# Patient Record
Sex: Male | Born: 1985 | Race: Black or African American | Hispanic: No | Marital: Single | Smoking: Never smoker
Health system: Southern US, Community
[De-identification: ages and names within clinical notes are randomized; demographics above are authoritative.]

## PROBLEM LIST (undated history)

## (undated) DIAGNOSIS — Z9889 Other specified postprocedural states: Secondary | ICD-10-CM

## (undated) DIAGNOSIS — K219 Gastro-esophageal reflux disease without esophagitis: Secondary | ICD-10-CM

## (undated) DIAGNOSIS — T8859XA Other complications of anesthesia, initial encounter: Secondary | ICD-10-CM

## (undated) DIAGNOSIS — R011 Cardiac murmur, unspecified: Secondary | ICD-10-CM

## (undated) DIAGNOSIS — T4145XA Adverse effect of unspecified anesthetic, initial encounter: Secondary | ICD-10-CM

## (undated) DIAGNOSIS — R112 Nausea with vomiting, unspecified: Secondary | ICD-10-CM

## (undated) HISTORY — PX: OTHER SURGICAL HISTORY: SHX169

---
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
## 2007-08-16 ENCOUNTER — Emergency Department (HOSPITAL_COMMUNITY): Admission: EM | Admit: 2007-08-16 | Discharge: 2007-08-16 | Payer: Self-pay | Admitting: Emergency Medicine

## 2008-11-27 ENCOUNTER — Emergency Department (HOSPITAL_COMMUNITY): Admission: EM | Admit: 2008-11-27 | Discharge: 2008-11-27 | Payer: Self-pay | Admitting: Family Medicine

## 2008-12-21 ENCOUNTER — Ambulatory Visit (HOSPITAL_BASED_OUTPATIENT_CLINIC_OR_DEPARTMENT_OTHER): Admission: RE | Admit: 2008-12-21 | Discharge: 2008-12-21 | Payer: Self-pay | Admitting: Orthopedic Surgery

## 2010-03-06 ENCOUNTER — Emergency Department (HOSPITAL_BASED_OUTPATIENT_CLINIC_OR_DEPARTMENT_OTHER): Admission: EM | Admit: 2010-03-06 | Discharge: 2010-02-11 | Payer: Self-pay | Admitting: Emergency Medicine

## 2010-06-10 LAB — GC/CHLAMYDIA PROBE AMP, GENITAL
Chlamydia, DNA Probe: NEGATIVE
GC Probe Amp, Genital: NEGATIVE

## 2013-11-15 ENCOUNTER — Ambulatory Visit (HOSPITAL_COMMUNITY)
Admission: AD | Admit: 2013-11-15 | Discharge: 2013-11-15 | Disposition: A | Discharge status: Home / Self Care | Payer: 59 | Source: Ambulatory Visit | Attending: Urology | Admitting: Urology

## 2013-11-15 ENCOUNTER — Inpatient Hospital Stay (HOSPITAL_COMMUNITY): Payer: 59 | Admitting: Anesthesiology

## 2013-11-15 ENCOUNTER — Encounter (HOSPITAL_COMMUNITY): Payer: Self-pay | Admitting: *Deleted

## 2013-11-15 ENCOUNTER — Other Ambulatory Visit: Payer: Self-pay | Admitting: Urology

## 2013-11-15 ENCOUNTER — Encounter (HOSPITAL_COMMUNITY)
Admission: AD | Disposition: A | Discharge status: Home / Self Care | Payer: Self-pay | Source: Ambulatory Visit | Attending: Urology

## 2013-11-15 ENCOUNTER — Encounter (HOSPITAL_COMMUNITY): Payer: 59 | Admitting: Anesthesiology

## 2013-11-15 DIAGNOSIS — N4400 Torsion of testis, unspecified: Secondary | ICD-10-CM | POA: Diagnosis not present

## 2013-11-15 HISTORY — DX: Other complications of anesthesia, initial encounter: T88.59XA

## 2013-11-15 HISTORY — DX: Other specified postprocedural states: R11.2

## 2013-11-15 HISTORY — DX: Adverse effect of unspecified anesthetic, initial encounter: T41.45XA

## 2013-11-15 HISTORY — DX: Gastro-esophageal reflux disease without esophagitis: K21.9

## 2013-11-15 HISTORY — PX: SCROTAL EXPLORATION: SHX2386

## 2013-11-15 HISTORY — PX: ORCHIOPEXY: SHX479

## 2013-11-15 HISTORY — DX: Other specified postprocedural states: Z98.890

## 2013-11-15 HISTORY — DX: Cardiac murmur, unspecified: R01.1

## 2013-11-15 SURGERY — EXPLORATION, SCROTUM
Anesthesia: General | Site: Scrotum | Laterality: Left

## 2013-11-15 MED ORDER — BUPIVACAINE HCL (PF) 0.25 % IJ SOLN
INTRAMUSCULAR | Status: DC | PRN
  Administered 2013-11-15: 5 mL

## 2013-11-15 MED ORDER — DEXAMETHASONE SODIUM PHOSPHATE 10 MG/ML IJ SOLN
INTRAMUSCULAR | Status: DC | PRN
  Administered 2013-11-15: 10 mg via INTRAVENOUS

## 2013-11-15 MED ORDER — SUCCINYLCHOLINE CHLORIDE 20 MG/ML IJ SOLN
INTRAMUSCULAR | Status: DC | PRN
  Administered 2013-11-15: 100 mg via INTRAVENOUS

## 2013-11-15 MED ORDER — HYDROCODONE-ACETAMINOPHEN 5-325 MG PO TABS: 1.0000 | ORAL_TABLET | Freq: Four times a day (QID) | ORAL | Status: DC | PRN

## 2013-11-15 MED ORDER — MIDAZOLAM HCL 5 MG/5ML IJ SOLN
INTRAMUSCULAR | Status: DC | PRN
  Administered 2013-11-15: 2 mg via INTRAVENOUS

## 2013-11-15 MED ORDER — FENTANYL CITRATE 0.05 MG/ML IJ SOLN
25.0000 ug | INTRAMUSCULAR | Status: DC | PRN
  Administered 2013-11-15 (×2): 25 ug via INTRAVENOUS
  Administered 2013-11-15: 50 ug via INTRAVENOUS

## 2013-11-15 MED ORDER — FENTANYL CITRATE 0.05 MG/ML IJ SOLN
INTRAMUSCULAR | Status: DC | PRN
  Administered 2013-11-15: 200 ug via INTRAVENOUS
  Administered 2013-11-15: 50 ug via INTRAVENOUS

## 2013-11-15 MED ORDER — PROPOFOL 10 MG/ML IV BOLUS
INTRAVENOUS | Status: DC | PRN
  Administered 2013-11-15: 200 mg via INTRAVENOUS

## 2013-11-15 MED ORDER — LIDOCAINE HCL (CARDIAC) 20 MG/ML IV SOLN
INTRAVENOUS | Status: DC | PRN
  Administered 2013-11-15: 100 mg via INTRAVENOUS

## 2013-11-15 MED ORDER — CEFAZOLIN SODIUM-DEXTROSE 2-3 GM-% IV SOLR
2.0000 g | INTRAVENOUS | Status: AC
  Administered 2013-11-15: 2 g via INTRAVENOUS

## 2013-11-15 MED ORDER — LACTATED RINGERS IV SOLN
INTRAVENOUS | Status: DC | PRN
  Administered 2013-11-15: 19:00:00 via INTRAVENOUS

## 2013-11-15 MED ORDER — ONDANSETRON HCL 4 MG/2ML IJ SOLN
INTRAMUSCULAR | Status: DC | PRN
  Administered 2013-11-15: 4 mg via INTRAVENOUS

## 2013-11-15 MED ORDER — BUPIVACAINE HCL (PF) 0.25 % IJ SOLN
INTRAMUSCULAR | Status: AC
  Filled 2013-11-15: qty 30

## 2013-11-15 MED ORDER — LACTATED RINGERS IV SOLN: INTRAVENOUS | Status: DC

## 2013-11-15 SURGICAL SUPPLY — 24 items
BLADE HEX COATED 2.75 (ELECTRODE) ×4 IMPLANT
BNDG GAUZE ELAST 4 BULKY (GAUZE/BANDAGES/DRESSINGS) ×4 IMPLANT
COVER SURGICAL LIGHT HANDLE (MISCELLANEOUS) ×4 IMPLANT
DRAPE PED LAPAROTOMY (DRAPES) ×4 IMPLANT
ELECT REM PT RETURN 9FT ADLT (ELECTROSURGICAL) ×4
ELECTRODE REM PT RTRN 9FT ADLT (ELECTROSURGICAL) ×2 IMPLANT
GLOVE BIOGEL M STRL SZ7.5 (GLOVE) ×4 IMPLANT
GOWN STRL REUS W/TWL LRG LVL3 (GOWN DISPOSABLE) ×4 IMPLANT
KIT BASIN OR (CUSTOM PROCEDURE TRAY) ×4 IMPLANT
NEEDLE HYPO 22GX1.5 SAFETY (NEEDLE) IMPLANT
NS IRRIG 1000ML POUR BTL (IV SOLUTION) ×4 IMPLANT
PACK GENERAL/GYN (CUSTOM PROCEDURE TRAY) ×4 IMPLANT
SUPPORT SCROTAL LG STRP (MISCELLANEOUS) ×3 IMPLANT
SUPPORTER ATHLETIC LG (MISCELLANEOUS) ×1
SUT CHROMIC 3 0 SH 27 (SUTURE) ×12 IMPLANT
SUT CHROMIC 4 0 PS 2 18 (SUTURE) ×4 IMPLANT
SUT PROLENE 4 0 RB 1 (SUTURE) ×2
SUT PROLENE 4-0 RB1 .5 CRCL 36 (SUTURE) ×2 IMPLANT
SUT VIC AB 2-0 UR5 27 (SUTURE) IMPLANT
SUT VICRYL 0 TIES 12 18 (SUTURE) IMPLANT
SYR CONTROL 10ML LL (SYRINGE) IMPLANT
TOWEL OR 17X26 10 PK STRL BLUE (TOWEL DISPOSABLE) ×8 IMPLANT
TOWEL OR NON WOVEN STRL DISP B (DISPOSABLE) ×4 IMPLANT
WATER STERILE IRR 1500ML POUR (IV SOLUTION) ×4 IMPLANT

## 2013-11-15 NOTE — Op Note (Signed)
Preoperative diagnosis:  1.  Left testicular torsion  Postoperative diagnosis: 1. Left testicular torsion  Procedure(s): 1. Scrotal exploration 2. Bilateral orchidopexy  Surgeon: Dr. Rolly SalterLester S. Laylana Gerwig, Jr  Assistant: Dr. Julius BowelsJed Ferguson  Anesthesia: General  Complications: None  EBL: Minimal  Intraoperative findings: Intraoperative inspection revealed a torsed left testis.  The left testis appeared non-viable.  The right testis appeared normal.  Indication: Mr. Whitney PostLogan is a 28 year old gentleman who presented today after developing left-sided testicular pain 1 week ago.  His pain was severe for 2-3 days and then subsequently improved.  His pain worsened today and he presented for evaluation.  He underwent a scrotal ultrasound and findings were consistent with left testicular torsion as well as his physical exam.  He was recommended to undergo urgent scrotal exploration for further evaluation.  He was counseled that he likely would have a nonsalvageable left testis considering his pain began 1 week ago.  It was recommended that if this testis was deemed to be unsalvageable that he undergo an orchiectomy.  It was explained that leaving a nonsalvageable testis in place might result in continued chronic pain and may place him at risk for infection or other, complications and that he likely would develop an atrophic testis over time.  Despite this counseling, he adamantly wish to keep this testis even if it was deemed to be unsalvageable.  It was also recommended that he undergo a right-sided orchiopexy to prevent contralateral torsion in the future.  We reviewed the potential risks and complications associated with the planned procedure as well as the expected recovery process.  He expresses understanding and gave his informed consent.  Description of procedure:  The patient was taken to the operating room and a general anesthetic was administered.  He was given preoperative antibiotics, placed in  the supine position, and prepped and draped in the usual sterile fashion.  Next, a preoperative timeout was performed.  An incision was made in the median raphae of the scrotum and carried down through dartos fascia.  The underlying spermatic fascial layers were incised and the tunic vaginalis was opened.  There was noted to be some dark fluid which was removed.  The testis was clearly noted to be torsed.  It was detorsed and continued to remain dark without evidence of viability.  No testicular masses were noted.  He did have an appendix testis which was identified and removed.  Considering the patient's adamant request to keep his testis, he underwent a left-sided orchiopexy.  4-0 Prolene sutures were placed for 3 point fixation between the tunica albuginea and the medial, inferior, and lateral scrotal wall.  Dartos fascia and the spermatic fascial layers were then incised over the right testis which was delivered after opening the tunica vaginalis.  This testis appeared to be normal in appearance without masses and appeared to be quite viable.  An appendix testis was again identified and was removed. Again, 4-0 Prolene sutures were used to perform a 3 point fixation orchiopexy in a similar fashion the contralateral side.  The dartos muscular layers were then closed with a running 3-0 chromic suture.  The skin was reapproximated with a 4-0 chromic running horizontal mattress suture.  Sterile dressing was applied.  The patient tolerated the procedure well and without complications.  He was able to be awakened and transferred to the recovery unit in satisfactory condition.

## 2013-11-15 NOTE — Anesthesia Procedure Notes (Signed)
Procedure Name: Intubation Date/Time: 11/15/2013 7:03 PM Performed by: Leroy LibmanEARDON, Carlos Vigorito L Patient Re-evaluated:Patient Re-evaluated prior to inductionOxygen Delivery Method: Circle system utilized Preoxygenation: Pre-oxygenation with 100% oxygen Intubation Type: IV induction, Cricoid Pressure applied and Rapid sequence Laryngoscope Size: Miller and 3 Grade View: Grade I Tube type: Oral Tube size: 8.0 mm Number of attempts: 1 Airway Equipment and Method: Stylet Placement Confirmation: ETT inserted through vocal cords under direct vision,  breath sounds checked- equal and bilateral and positive ETCO2 Secured at: 21 cm Tube secured with: Tape Dental Injury: Teeth and Oropharynx as per pre-operative assessment

## 2013-11-15 NOTE — H&P (Signed)
Chief Complaint Left testicular pain   History of Present Illness Carlos Lewis is a 28 year old gentleman who presents today after developing severe left sided testicular pain 1 week ago. He states that he has had intermittent testicular pain throughout the years ever since he was 16. This most recent episode began 1 week ago and was severe and localized to the left side for 2-3 days. The pain in the testicle gradually subsided although he then noted increased swelling of the left hemiscrotum and the fact that his testis became very hard and firm. He continued having pain in the subinguinal region which brought him into the office today. He has denied any fever or associated voiding symptoms. There is no family history of testicular cancer.   Past Medical History Problems  1. History of cardiac murmur (V12.59) 2. History of esophageal reflux (V12.79)  Surgical History Problems  1. 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 History of Resect Lung, Chest Wall With Reconstruction And Prosthesis  Current Meds 1. No Reported Medications Recorded  Allergies Medication  1. No Known Drug Allergies  Family History Problems  1. Family history of Eye degeneration : Mother 2. Family history of diabetes mellitus (V18.0) : Mother 3. Family history of malignant neoplasm (V16.9) : Paternal Great Grandmother, Paternal  Uncle  Social History Problems    Never smoker  Review of Systems Constitutional, skin, eye, otolaryngeal, hematologic/lymphatic, cardiovascular, pulmonary, endocrine, musculoskeletal, gastrointestinal, neurological and psychiatric system(s) were reviewed and pertinent findings if present are noted.    Vitals Vital Signs [Data Includes: Last 1 Day]  Recorded: 19Aug2015 03:22PM  Height: 5 ft 11 in Weight: 195 lb  BMI Calculated: 27.2 BSA Calculated: 2.09 Blood Pressure: 135 / 77 Temperature: 97.9 F Heart Rate: 96  Physical Exam Constitutional:  Well nourished and well developed . No acute distress.  ENT:. The ears and nose are normal in appearance.  Neck: The appearance of the neck is normal and no neck mass is present.  Pulmonary: No respiratory distress, normal respiratory rhythm and effort and clear bilateral breath sounds.  Cardiovascular: Heart rate and rhythm are normal . No peripheral edema.  Abdomen: The abdomen is soft and nontender. No masses are palpated. No CVA tenderness. No hernias are palpable. No hepatosplenomegaly noted.  Genitourinary: He has a normal male phallus with a normal urethral meatus. The right testis is palpably normal and nontender and without masses. The left testis is enlarged and firm and mildly tender. No masses are noted. He has tenderness located along the distal spermatic cord. There are no scrotal lesions.  Lymphatics: The femoral and inguinal nodes are not enlarged or tender.  Skin: Normal skin turgor, no visible rash and no visible skin lesions.  Neuro/Psych:. Mood and affect are appropriate.    Results/Data Urine [Data Includes: Last 1 Day]   19Aug2015  COLOR YELLOW   APPEARANCE CLEAR   SPECIFIC GRAVITY 1.025   pH 5.5   GLUCOSE NEG mg/dL  BILIRUBIN NEG   KETONE NEG mg/dL  BLOOD SMALL   PROTEIN NEG mg/dL  UROBILINOGEN 0.2 mg/dL  NITRITE NEG   LEUKOCYTE ESTERASE NEG   SQUAMOUS EPITHELIAL/HPF RARE   WBC NONE SEEN WBC/hpf  RBC 0-2 RBC/hpf  BACTERIA NONE SEEN   CRYSTALS NONE SEEN   CASTS NONE SEEN    A scrotal ultrasound was performed due to concerns about testicular torsion. Findings are as dictated separately.   Assessment Assessed  1. Testicular pain (608.9)  Plan Health Maintenance  1. UA  With REFLEX; [Do Not Release]; Status:Complete;   Done: 19Aug2015 03:02PM Testicular pain  2. SCROTAL U/S; Status:Resulted - Requires Verification;   Done: 19Aug2015 12:00AM  Discussion/Summary 1. Left testicular torsion: He appears to have left testicular torsion based on his ultrasound  and his history. Considering the fact this began 1 week ago, I explained that this testis is likely nonviable and nonsalvageable. However, I did recommend proceeding with scrotal exploration for evaluation with a small chance we could potentially salvage the testis and particularly to proceed with orchiopexy of the contralateral right testis to avoid torsion on that side. I recommended that we remove the left testis if it was nonsalvageable. However, patient strongly wishes to leave the testis in place despite my discussion with him that this may result in prolonged scrotal pain. He also understands that this testis would likely become atrophic and small and would not provide him any functional benefit. 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 We reviewed the potential risks of the surgery including but not limited to bleeding, infection, risks of anesthesia, injury or damage to the testicles which could result in testosterone deficiency or infertility, risk of need for further procedures, continued scrotal pain, etc. He expressed his understanding and gives his informed consent to proceed as planned.   Signatures Electronically signed by : Heloise Purpura, M.D.; Nov 15 2013  5:11PM EST

## 2013-11-15 NOTE — Progress Notes (Signed)
Dr Leta JunglingEwell made aware that patient had spinach and carrots at 1500 today.

## 2013-11-15 NOTE — Anesthesia Preprocedure Evaluation (Addendum)
Anesthesia Evaluation  Patient identified by MRN, date of birth, ID band Patient awake    Reviewed: Allergy & Precautions, H&P , NPO status , Patient's Chart, lab work & pertinent test results  Airway Mallampati: II  TM Distance: >3 FB Neck ROM: full    Dental no notable dental hx. (+) Teeth Intact, Dental Advisory Given   Pulmonary neg pulmonary ROS,  breath sounds clear to auscultation  Pulmonary exam normal       Cardiovascular Exercise Tolerance: Good negative cardio ROS  Rhythm:regular Rate:Normal     Neuro/Psych negative neurological ROS  negative psych ROS   GI/Hepatic negative GI ROS, Neg liver ROS,   Endo/Other  negative endocrine ROS  Renal/GU negative Renal ROS  negative genitourinary   Musculoskeletal   Abdominal   Peds  Hematology negative hematology ROS (+)   Anesthesia Other Findings   Reproductive/Obstetrics negative OB ROS                             Anesthesia Physical Anesthesia Plan  ASA: I and emergent  Anesthesia Plan: General   Post-op Pain Management:    Induction: Intravenous, Rapid sequence and Cricoid pressure planned  Airway Management Planned: Oral ETT  Additional Equipment:   Intra-op Plan:   Post-operative Plan: Extubation in OR  Informed Consent: I have reviewed the patients History and Physical, chart, labs and discussed the procedure including the risks, benefits and alternatives for the proposed anesthesia with the patient or authorized representative who has indicated his/her understanding and acceptance.   Dental Advisory Given  Plan Discussed with: CRNA and Surgeon  Anesthesia Plan Comments:         Anesthesia Quick Evaluation  

## 2013-11-15 NOTE — Discharge Instructions (Signed)
1) Keep ice on scrotum intermittently for up to 48-72 hours. 2) Try to limit activity and try to rest as much as possible for next 72 hours. 3) Avoid lifting > 10 lbs for a minimum of 2 weeks and longer if still having pain. 4) Call if fever > 101, pain that is not controlled, or any problems with wound healing.

## 2013-11-15 NOTE — Anesthesia Postprocedure Evaluation (Signed)
  Anesthesia Post-op Note  Patient: Carlos Lewis  Procedure(s) Performed: Procedure(s) (LRB): SCROTUM EXPLORATION (Left) bilateral orchiopexy (Bilateral)  Patient Location: PACU  Anesthesia Type: General  Level of Consciousness: awake and alert   Airway and Oxygen Therapy: Patient Spontanous Breathing  Post-op Pain: mild  Post-op Assessment: Post-op Vital signs reviewed, Patient's Cardiovascular Status Stable, Respiratory Function Stable, Patent Airway and No signs of Nausea or vomiting  Last Vitals:  Filed Vitals:   11/15/13 2045  BP: 131/75  Pulse: 95  Temp:   Resp: 13    Post-op Vital Signs: stable   Complications: No apparent anesthesia complications

## 2013-11-15 NOTE — Transfer of Care (Signed)
Immediate Anesthesia Transfer of Care Note  Patient: Carlos Lewis  Procedure(s) Performed: Procedure(s): SCROTUM EXPLORATION (Left) bilateral orchiopexy (Bilateral)  Patient Location: PACU  Anesthesia Type:General  Level of Consciousness: awake and alert   Airway & Oxygen Therapy: Patient Spontanous Breathing and Patient connected to face mask oxygen  Post-op Assessment: Report given to PACU RN and Post -op Vital signs reviewed and stable  Post vital signs: Reviewed and stable  Complications: No apparent anesthesia complications

## 2013-11-16 ENCOUNTER — Encounter (HOSPITAL_COMMUNITY): Payer: Self-pay | Admitting: Urology

## 2018-12-13 ENCOUNTER — Emergency Department (HOSPITAL_BASED_OUTPATIENT_CLINIC_OR_DEPARTMENT_OTHER): Payer: 59

## 2018-12-13 ENCOUNTER — Other Ambulatory Visit: Payer: Self-pay

## 2018-12-13 ENCOUNTER — Encounter (HOSPITAL_BASED_OUTPATIENT_CLINIC_OR_DEPARTMENT_OTHER): Payer: Self-pay

## 2018-12-13 ENCOUNTER — Emergency Department (HOSPITAL_BASED_OUTPATIENT_CLINIC_OR_DEPARTMENT_OTHER)
Admission: EM | Admit: 2018-12-13 | Discharge: 2018-12-14 | Disposition: A | Discharge status: Home / Self Care | Payer: 59 | Attending: Emergency Medicine | Admitting: Emergency Medicine

## 2018-12-13 DIAGNOSIS — Z79899 Other long term (current) drug therapy: Secondary | ICD-10-CM | POA: Diagnosis not present

## 2018-12-13 DIAGNOSIS — F419 Anxiety disorder, unspecified: Secondary | ICD-10-CM | POA: Diagnosis not present

## 2018-12-13 DIAGNOSIS — R079 Chest pain, unspecified: Secondary | ICD-10-CM | POA: Diagnosis present

## 2018-12-13 LAB — BASIC METABOLIC PANEL
Anion gap: 13 (ref 5–15)
BUN: 12 mg/dL (ref 6–20)
CO2: 24 mmol/L (ref 22–32)
Calcium: 9.8 mg/dL (ref 8.9–10.3)
Chloride: 103 mmol/L (ref 98–111)
Creatinine, Ser: 1.2 mg/dL (ref 0.61–1.24)
GFR calc Af Amer: >60 mL/min (ref >60)
GFR calc non Af Amer: >60 mL/min (ref >60)
Glucose, Bld: 106 mg/dL — ABNORMAL HIGH (ref 70–99)
Potassium: 3.4 mmol/L — ABNORMAL LOW (ref 3.5–5.1)
Sodium: 140 mmol/L (ref 135–145)

## 2018-12-13 LAB — CBC
HCT: 45.5 % (ref 39.0–52.0)
Hemoglobin: 15.2 g/dL (ref 13.0–17.0)
MCH: 28.8 pg (ref 26.0–34.0)
MCHC: 33.4 g/dL (ref 30.0–36.0)
MCV: 86.2 fL (ref 80.0–100.0)
Platelets: 259 10*3/uL (ref 150–400)
RBC: 5.28 MIL/uL (ref 4.22–5.81)
RDW: 12.1 % (ref 11.5–15.5)
WBC: 7.9 10*3/uL (ref 4.0–10.5)
nRBC: 0 % (ref 0.0–0.2)

## 2018-12-13 LAB — TROPONIN I (HIGH SENSITIVITY): Troponin I (High Sensitivity): <2 ng/L (ref <18)

## 2018-12-13 MED ORDER — SODIUM CHLORIDE 0.9% FLUSH
3.0000 mL | Freq: Once | INTRAVENOUS | Status: DC
  Filled 2018-12-13: qty 3

## 2018-12-13 NOTE — ED Triage Notes (Signed)
Pt states he has been having stress x 2-3 months-CP x 2-3 weeks-anxiety attacks x 2-3 days-NAD-steady gait

## 2018-12-14 ENCOUNTER — Encounter (HOSPITAL_BASED_OUTPATIENT_CLINIC_OR_DEPARTMENT_OTHER): Payer: Self-pay | Admitting: Emergency Medicine

## 2018-12-14 LAB — D-DIMER, QUANTITATIVE: D-Dimer, Quant: 0.29 ug/mL-FEU (ref 0.00–0.50)

## 2018-12-14 NOTE — ED Provider Notes (Signed)
[facility] EMERGENCY DEPARTMENT Provider Note   CSN: [id number] Arrival date & time: 12/13/18  2227     History   Chief Complaint Chief Complaint  Patient presents with  . Chest Pain  . Anxiety    HPI Carlos Lewis is a 33 y.o. male.     The history is provided by the patient.  Anxiety This is a chronic problem. The current episode started more than 1 week ago. The problem occurs constantly. The problem has not changed since onset.Associated symptoms include chest pain. Pertinent negatives include no abdominal pain, no headaches and no shortness of breath. Nothing aggravates the symptoms. Nothing relieves the symptoms. He has tried nothing for the symptoms. The treatment provided no relief.  No DOE, no SOB.    Past Medical History:  Diagnosis Date  . Cardiac murmur   . Complication of anesthesia   . Esophageal reflux   . PONV (postoperative nausea and vomiting)     There are no active problems to display for this patient.   Past Surgical History:  Procedure Laterality Date  . ORCHIOPEXY Bilateral 11/15/2013   Procedure: bilateral orchiopexy;  Surgeon: Heloise PurpuraLester Borden, MD;  Location: [facility] ORS;  Service: Urology;  Laterality: Bilateral;  . pectorial muscle Left   . SCROTAL EXPLORATION Left 11/15/2013   Procedure: SCROTUM EXPLORATION;  Surgeon: Heloise PurpuraLester Borden, MD;  Location: [facility];  Service: Urology;  Laterality: Left;        Home Medications    Prior to Admission medications   Medication Sig Start Date End Date Taking? Authorizing Provider  CHOLINE PO Take 1 tablet by mouth daily.    [provider]  HYDROcodone-acetaminophen (NORCO/VICODIN) 5-325 MG per tablet Take 1-2 tablets by mouth every 6 (six) hours as needed. 11/15/13   Heloise PurpuraBorden, Lester, MD  PROTEIN PO Take 1 tablet by mouth daily.    [provider]    Family History No family history on file.  Social History Social History   Tobacco Use  . Smoking status: Never Smoker  .  Smokeless tobacco: Never Used  Substance Use Topics  . Alcohol use: Yes    Comment: occasionally  . Drug use: No     Allergies   Other   Review of Systems Review of Systems  Constitutional: Negative for diaphoresis and fever.  HENT: Negative for congestion.   Eyes: Negative for visual disturbance.  Respiratory: Negative for shortness of breath.   Cardiovascular: Positive for chest pain. Negative for palpitations and leg swelling.  Gastrointestinal: Negative for abdominal pain.  Genitourinary: Negative for difficulty urinating.  Musculoskeletal: Negative for arthralgias.  Skin: Negative for wound.  Neurological: Negative for headaches.  Psychiatric/Behavioral: Negative for agitation.  All other systems reviewed and are negative.    Physical Exam Updated Vital Signs BP 136/78 (BP Location: Right Arm)   Pulse 80   Temp 99.5 F (37.5 C) (Oral)   Resp 16   Ht 5\' 11"  (1.803 m)   Wt 85.7 kg   SpO2 99%   BMI 26.36 kg/m   Physical Exam Vitals signs and nursing note reviewed.  Constitutional:      General: He is not in acute distress.    Appearance: He is normal weight.  HENT:     Head: Normocephalic and atraumatic.     Nose: Nose normal.  Eyes:     Conjunctiva/sclera: Conjunctivae normal.     Pupils: Pupils are equal, round, and reactive to light.  Neck:     Musculoskeletal: Normal  range of motion and neck supple.  Cardiovascular:     Rate and Rhythm: Normal rate and regular rhythm.     Pulses: Normal pulses.     Heart sounds: Normal heart sounds.  Pulmonary:     Effort: Pulmonary effort is normal.     Breath sounds: Normal breath sounds.  Abdominal:     General: Abdomen is flat. Bowel sounds are normal.     Tenderness: There is no abdominal tenderness. There is no guarding.  Musculoskeletal: Normal range of motion.  Skin:    General: Skin is warm and dry.     Capillary Refill: Capillary refill takes less than 2 seconds.  Neurological:     Mental Status:  He is alert and oriented to person, place, and time.  Psychiatric:        Mood and Affect: Mood is anxious.        Speech: Speech is not rapid and pressured.        Thought Content: Thought content does not include homicidal or suicidal plan.      ED Treatments / Results  Labs (all labs ordered are listed, but only abnormal results are displayed) Results for orders placed or performed during the hospital encounter of 12/13/18  Basic metabolic panel  Result Value Ref Range   Sodium 140 135 - 145 mmol/L   Potassium 3.4 (L) 3.5 - 5.1 mmol/L   Chloride 103 98 - 111 mmol/L   CO2 24 22 - 32 mmol/L   Glucose, Bld 106 (H) 70 - 99 mg/dL   BUN 12 6 - 20 mg/dL   Creatinine, Ser 2.95 0.61 - 1.24 mg/dL   Calcium 9.8 8.9 - 28.4 mg/dL   GFR calc non Af Amer >60 >60 mL/min   GFR calc Af Amer >60 >60 mL/min   Anion gap 13 5 - 15  CBC  Result Value Ref Range   WBC 7.9 4.0 - 10.5 K/uL   RBC 5.28 4.22 - 5.81 MIL/uL   Hemoglobin 15.2 13.0 - 17.0 g/dL   HCT 13.2 44.0 - 10.2 %   MCV 86.2 80.0 - 100.0 fL   MCH 28.8 26.0 - 34.0 pg   MCHC 33.4 30.0 - 36.0 g/dL   RDW 72.5 36.6 - 44.0 %   Platelets 259 150 - 400 K/uL   nRBC 0.0 0.0 - 0.2 %  D-dimer, quantitative (not at [facility])  Result Value Ref Range   D-Dimer, Quant 0.29 0.00 - 0.50 ug/mL-FEU  Troponin I (High Sensitivity)  Result Value Ref Range   Troponin I (High Sensitivity) <2 <18 ng/L   Dg Chest 2 View  Result Date: 12/13/2018 CLINICAL DATA:  Chest pain for 2-3 weeks EXAM: CHEST - 2 VIEW COMPARISON:  None. FINDINGS: The heart size and mediastinal contours are within normal limits. Both lungs are clear. The visualized skeletal structures are unremarkable. IMPRESSION: No active cardiopulmonary disease. Electronically Signed   By: Alcide Clever M.D.   On: 12/13/2018 23:25    EKG EKG Interpretation  Date/Time:  Tuesday December 13 2018 22:37:42 EDT Ventricular Rate:  104 PR Interval:  192 QRS Duration: 90 QT Interval:  304 QTC  Calculation: 399 R Axis:   90 Text Interpretation:  Sinus tachycardia Rightward axis Confirmed by Nicanor Alcon, Deandre Stansel ([id number]) on 12/13/2018 11:44:30 PM   Radiology Dg Chest 2 View  Result Date: 12/13/2018 CLINICAL DATA:  Chest pain for 2-3 weeks EXAM: CHEST - 2 VIEW COMPARISON:  None. FINDINGS: The heart size and mediastinal contours are  within normal limits. Both lungs are clear. The visualized skeletal structures are unremarkable. IMPRESSION: No active cardiopulmonary disease. Electronically Signed   By: Inez Catalina M.D.   On: 12/13/2018 23:25    Procedures Procedures (including critical care time)  Medications Ordered in ED Medications  sodium chloride flush (NS) 0.9 % injection 3 mL (has no administration in time range)    Counseled patient on stress management. Given outpatient resources.  Patient does not meet criteria for inpatient [facility] assessment.  Patient is stable for discharge.  Given time course of symptoms one negative troponin is sufficient to exclude ACS heart score is 1 very low risk for MACE.  Ruled out for PE with negative Ddimer.   Carlos Lewis was evaluated in Emergency Department on 12/14/2018 for the symptoms described in the history of present illness. He was evaluated in the context of the global COVID-19 pandemic, which necessitated consideration that the patient might be at risk for infection with the SARS-CoV-2 virus that causes COVID-19. Institutional protocols and algorithms that pertain to the evaluation of patients at risk for COVID-19 are in a state of rapid change based on information released by regulatory bodies including the CDC and federal and state organizations. These policies and algorithms were followed during the patient's care in the ED.   Final Clinical Impressions(s) / ED Diagnoses   Final diagnoses:  Anxiety    Return for intractable cough, coughing up blood,fevers >100.4 unrelieved by medication, shortness of breath, intractable vomiting, chest  pain, shortness of breath, weakness,numbness, changes in speech, facial asymmetry,abdominal pain, passing out,Inability to tolerate liquids or food, cough, altered mental status or any concerns. No signs of systemic illness or infection. The patient is nontoxic-appearing on exam and vital signs are within normal limits.   I have reviewed the triage vital signs and the nursing notes. Pertinent labs &imaging results that were available during my care of the patient were reviewed by me and considered in my medical decision making (see chart for details).After history, exam, and medical workup I feel the patient has beenappropriately medically screened and is safe for discharge home. Pertinent diagnoses were discussed with the patient. Patient was given return precautions.   Ehtan Delfavero, MD 12/14/18 [phone]

## 2020-08-18 ENCOUNTER — Emergency Department (HOSPITAL_COMMUNITY)
Admission: EM | Admit: 2020-08-18 | Discharge: 2020-08-18 | Disposition: A | Discharge status: Home / Self Care | Payer: 59 | Attending: Emergency Medicine | Admitting: Emergency Medicine

## 2020-08-18 ENCOUNTER — Other Ambulatory Visit: Payer: Self-pay

## 2020-08-18 ENCOUNTER — Encounter (HOSPITAL_COMMUNITY): Payer: Self-pay | Admitting: Emergency Medicine

## 2020-08-18 DIAGNOSIS — R200 Anesthesia of skin: Secondary | ICD-10-CM | POA: Insufficient documentation

## 2020-08-18 DIAGNOSIS — I10 Essential (primary) hypertension: Secondary | ICD-10-CM | POA: Diagnosis not present

## 2020-08-18 LAB — COMPREHENSIVE METABOLIC PANEL
ALT: 17 U/L (ref 0–44)
AST: 20 U/L (ref 15–41)
Albumin: 4.6 g/dL (ref 3.5–5.0)
Alkaline Phosphatase: 44 U/L (ref 38–126)
Anion gap: 12 (ref 5–15)
BUN: 11 mg/dL (ref 6–20)
CO2: 24 mmol/L (ref 22–32)
Calcium: 9.7 mg/dL (ref 8.9–10.3)
Chloride: 100 mmol/L (ref 98–111)
Creatinine, Ser: 1.17 mg/dL (ref 0.61–1.24)
GFR, Estimated: >60 mL/min (ref >60)
Glucose, Bld: 99 mg/dL (ref 70–99)
Potassium: 3.9 mmol/L (ref 3.5–5.1)
Sodium: 136 mmol/L (ref 135–145)
Total Bilirubin: 1.6 mg/dL — ABNORMAL HIGH (ref 0.3–1.2)
Total Protein: 8 g/dL (ref 6.5–8.1)

## 2020-08-18 LAB — CBC WITH DIFFERENTIAL/PLATELET
Abs Immature Granulocytes: 0.01 10*3/uL (ref 0.00–0.07)
Basophils Absolute: 0 10*3/uL (ref 0.0–0.1)
Basophils Relative: 1 %
Eosinophils Absolute: 0 10*3/uL (ref 0.0–0.5)
Eosinophils Relative: 0 %
HCT: 47.2 % (ref 39.0–52.0)
Hemoglobin: 15.6 g/dL (ref 13.0–17.0)
Immature Granulocytes: 0 %
Lymphocytes Relative: 28 %
Lymphs Abs: 1.2 10*3/uL (ref 0.7–4.0)
MCH: 28.6 pg (ref 26.0–34.0)
MCHC: 33.1 g/dL (ref 30.0–36.0)
MCV: 86.6 fL (ref 80.0–100.0)
Monocytes Absolute: 0.4 10*3/uL (ref 0.1–1.0)
Monocytes Relative: 9 %
Neutro Abs: 2.6 10*3/uL (ref 1.7–7.7)
Neutrophils Relative %: 62 %
Platelets: 267 10*3/uL (ref 150–400)
RBC: 5.45 MIL/uL (ref 4.22–5.81)
RDW: 12 % (ref 11.5–15.5)
WBC: 4.3 10*3/uL (ref 4.0–10.5)
nRBC: 0 % (ref 0.0–0.2)

## 2020-08-18 MED ORDER — HYDROXYZINE HCL 25 MG PO TABS: 25.0000 mg | ORAL_TABLET | Freq: Three times a day (TID) | ORAL | 0 refills | Status: DC | PRN

## 2020-08-18 NOTE — ED Provider Notes (Signed)
[facility] EMERGENCY DEPARTMENT Provider Note   CSN: [id number] Arrival date & time: 08/18/20  1230     History Chief Complaint  Patient presents with  . Hypertension    Carlos Lewis is a 35 y.o. male.  HPI  Patient presents due to facial numbness.  Present in the left side of his face and also involves his left arm.  He feels it whenever he is stressed and then it goes away when he is no longer stressed.  This has been going on for several days.  He is also been checking his blood pressure for a long time twice a day and notes that it is elevated whenever he feels stressed out and then whenever he does not feel stressed that his blood pressure normalizes.  He has occasionally had a high heart rate whenever he is stressed out but he shows me the readings at bedside and he is almost always nontachycardic.  His mother recently had a stroke and he has had to take care of her in addition to working and this has made him feel very stressed out.  No focal weakness.  He never has persistent numbness.  No significant medical history.     Past Medical History:  Diagnosis Date  . Cardiac murmur   . Complication of anesthesia   . Esophageal reflux   . PONV (postoperative nausea and vomiting)     There are no problems to display for this patient.   Past Surgical History:  Procedure Laterality Date  . ORCHIOPEXY Bilateral 11/15/2013   Procedure: bilateral orchiopexy;  Surgeon: Heloise Purpura, MD;  Location: [facility];  Service: Urology;  Laterality: Bilateral;  . pectorial muscle Left   . SCROTAL EXPLORATION Left 11/15/2013   Procedure: SCROTUM EXPLORATION;  Surgeon: Heloise Purpura, MD;  Location: [facility];  Service: Urology;  Laterality: Left;       History reviewed. No pertinent family history.  Social History   Tobacco Use  . Smoking status: Never Smoker  . Smokeless tobacco: Never Used  Vaping Use  . Vaping Use: Never used  Substance Use Topics  . Alcohol use:  Yes    Comment: occasionally  . Drug use: No    Home Medications Prior to Admission medications   Medication Sig Start Date End Date Taking? Authorizing Provider  hydrOXYzine (ATARAX/VISTARIL) 25 MG tablet Take 1 tablet (25 mg total) by mouth every 8 (eight) hours as needed for up to 15 doses for anxiety. 08/18/20  Yes Jacklynn Bue, MD  CHOLINE PO Take 1 tablet by mouth daily.    [provider]  HYDROcodone-acetaminophen (NORCO/VICODIN) 5-325 MG per tablet Take 1-2 tablets by mouth every 6 (six) hours as needed. 11/15/13   Heloise Purpura, MD  PROTEIN PO Take 1 tablet by mouth daily.    [provider]    Allergies    Other  Review of Systems   Review of Systems  Constitutional: Negative for chills and fever.  HENT: Negative for ear pain and sore throat.   Eyes: Negative for pain and visual disturbance.  Respiratory: Negative for cough and shortness of breath.   Cardiovascular: Negative for chest pain and palpitations.  Gastrointestinal: Negative for abdominal pain and vomiting.  Genitourinary: Negative for dysuria and hematuria.  Musculoskeletal: Negative for back pain and gait problem.  Skin: Negative for color change and rash.  Neurological: Positive for numbness. Negative for syncope.  All other systems reviewed and are negative.   Physical Exam Updated Vital  Signs BP (!) 142/81   Pulse (!) 113   Temp 99.1 F (37.3 C)   Resp 18   SpO2 100%   Physical Exam Vitals and nursing note reviewed.  Constitutional:      Appearance: He is well-developed.  HENT:     Head: Normocephalic and atraumatic.  Eyes:     Extraocular Movements: Extraocular movements intact.     Conjunctiva/sclera: Conjunctivae normal.  Cardiovascular:     Rate and Rhythm: Normal rate and regular rhythm.     Heart sounds: No murmur heard.   Pulmonary:     Effort: Pulmonary effort is normal. No respiratory distress.     Breath sounds: Normal breath sounds.  Abdominal:      Palpations: Abdomen is soft.     Tenderness: There is no abdominal tenderness.  Musculoskeletal:     Cervical back: Normal range of motion and neck supple.     Right lower leg: No edema.     Left lower leg: No edema.  Skin:    General: Skin is warm and dry.  Neurological:     Mental Status: He is alert.     Comments:   MENTAL STATUS: AAOx3   LANG/SPEECH: Fluent, intact medical history with good comprehension   CRANIAL NERVES:   II: Pupils equal and reactive   III, IV, VI: EOM intact, no gaze preference or deviation   V: normal   VII: no facial asymmetry   VIII: normal hearing to speech   MOTOR: 5/5 in both upper and lower extremities   SENSORY:  Light touch sensation intact on both sides in V1 through V3 as well as bilateral upper and lower extremities.  Sharp dull discrimination is possibly diminished in the left upper extremity per the patient, equal in bilateral lower extremities.  The patient also notes that he thinks he feels light touch a little bit more on his right than his left but definitely feels that on both sides.    COORD: Normal finger to nose, no tremor, no dysmetria  Psychiatric:        Behavior: Behavior normal.        Thought Content: Thought content normal.     ED Results / Procedures / Treatments   Labs (all labs ordered are listed, but only abnormal results are displayed) Labs Reviewed  COMPREHENSIVE METABOLIC PANEL - Abnormal; Notable for the following components:      Result Value   Total Bilirubin 1.6 (*)    All other components within normal limits  CBC WITH DIFFERENTIAL/PLATELET    EKG EKG Interpretation  Date/Time:  Sunday Aug 18 2020 17:12:50 EDT Ventricular Rate:  104 PR Interval:  207 QRS Duration: 84 QT Interval:  307 QTC Calculation: 404 R Axis:   83 Text Interpretation: Sinus tachycardia Prolonged PR interval ST elev, probable normal early repol pattern no reciprocal changes Confirmed by Pricilla Loveless [phone]) on 08/18/2020 5:24:29  PM   Radiology No results found.  Procedures Procedures   Medications Ordered in ED Medications - No data to display  ED Course  I have reviewed the triage vital signs and the nursing notes.  Pertinent labs & imaging results that were available during my care of the patient were reviewed by me and considered in my medical decision making (see chart for details).    MDM Rules/Calculators/A&P                           Patient presents  with numbness, intermittently high blood pressures, anxiety.   No cardiopulmonary symptoms.  Tachycardic on presentation and has been variably so throughout his stay.  Neurologic exam is overall reassuring although he does have a possible abnormality with sensation.  Given this, we discussed the risk and benefits of a CT scan versus outpatient follow-up with neurology for persistent symptoms and the patient opts for outpatient follow-up.  I do not think an MRI is emergently dictated as his overall history and physical is not consistent with a stroke and the patient denies the numbness on history and then may or may not feel that consistently with exam so there is no clear timeline for this possible deficit.  Laboratory studies are reassuring.  I reviewed his EKG and note inferior and V2 T wave inversions in the inferior T wave inversions were present previously.  It is sinus rhythm, mildly tachycardic.  In the absence of cardiopulmonary symptoms, further testing is not emergently indicated especially given that the patient is clearly not persistently tachycardic at home and has not been tachycardic his entire stay here.  He will need to follow-up with his primary care doctor as well as a neurologist if his numbness symptoms persist and we discussed this.  I will prescribe Atarax to treat his anxiety in the outpatient setting for a brief period.  Return precautions discussed extensively.  Patient understands and agrees with the plan.   Final Clinical Impression(s)  / ED Diagnoses Final diagnoses:  Numbness    Rx / DC Orders ED Discharge Orders         Ordered    hydrOXYzine (ATARAX/VISTARIL) 25 MG tablet  Every 8 hours PRN        08/18/20 1721           Jacklynn Bue, MD 08/18/20 1752    Pricilla Loveless, MD 08/19/20 2348

## 2020-08-18 NOTE — ED Triage Notes (Signed)
Patient coming from home. Complaint of elevated blood pressure recently. Endorses right arm numbness that comes and goes for several days. VSS. NAD.

## 2020-08-18 NOTE — Discharge Instructions (Addendum)
Take the Atarax as needed whenever you have episodes of anxiety.  Come back to the ER if you develop significant and long-lasting numbness, weakness, or any other emergencies.  Otherwise please see both a primary care doctor and a neurologist and follow-up given that you have had some issues with numbness.  You can call your insurance company to see which doctors are covered

## 2020-08-18 NOTE — ED Provider Notes (Signed)
Emergency Medicine Provider Triage Evaluation Note  Carlos Lewis 35 y.o. male was evaluated in triage.  Pt complains of hypertension.  Patient reports that over the last 2 weeks, he has had intermittent high blood pressure.  He states he has no prior history of hypertension does not take any medications.  He does not follow with a primary care doctor.  He thought it was because he was eating chocolate and caffeine.  He states that he has been stressed recently.  He states that when he gets stressed, he feels some tingling sensation to his left side.  Currently denies any numbness/weakness.  No chest pain, difficulty breathing.  No abdominal pain, nausea/vomiting.   Review of Systems  Positive: Hypertension Negative: Chest pain, difficulty breathing, numbness/weakness, abdominal pain, nausea/vomiting.  Physical Exam  BP 134/82   Pulse 70   Temp 98.2 F (36.8 C) (Oral)   Resp 18   Ht 5\' 4"  (1.626 m)   Wt 65.8 kg   SpO2 100%   BMI 24.89 kg/m  Gen:   Awake, no distress   HEENT:  Atraumatic  Resp:  Normal effort  Cardiac:  Normal rate.  2+ radial pulses bilaterally. Abd:   Nondistended, nontender  MSK:   Moves extremities without difficulty.  Neuro:  Speech clear.  5/5 strength in bilateral upper and lower extremity  Other:     Medical Decision Making  Medically screening exam initiated at 1:05 PM  Appropriate orders placed.  Tupac Caraway was informed that the remainder of the evaluation will be completed by another provider, this initial triage assessment does not replace that evaluation. They are counseled that they will need to remain in the ED until the completion of their workup, including full H&P and results of any tests.  Risks of leaving the emergency department prior to completion of treatment were discussed. Patient was advised to inform ED staff if they are leaving before their treatment is complete. The patient acknowledged these risks and time was allowed for questions.      The patient appears stable so that the remainder of the MSE may be completed by another provider.   Clinical Impression  Hypertension   Portions of this note were generated with Dragon dictation software. Dictation errors may occur despite best attempts at proofreading.      Whitney Post, PA-C 08/18/20 1306    08/20/20, MD 08/18/20 1311

## 2020-08-20 ENCOUNTER — Encounter (HOSPITAL_COMMUNITY): Payer: Self-pay | Admitting: Emergency Medicine

## 2020-08-20 ENCOUNTER — Other Ambulatory Visit: Payer: Self-pay

## 2020-08-20 ENCOUNTER — Emergency Department (HOSPITAL_COMMUNITY): Payer: 59

## 2020-08-20 ENCOUNTER — Emergency Department (HOSPITAL_COMMUNITY)
Admission: EM | Admit: 2020-08-20 | Discharge: 2020-08-20 | Disposition: A | Discharge status: Home / Self Care | Payer: 59 | Attending: Emergency Medicine | Admitting: Emergency Medicine

## 2020-08-20 ENCOUNTER — Emergency Department (HOSPITAL_COMMUNITY)
Admission: EM | Admit: 2020-08-20 | Discharge: 2020-08-21 | Disposition: A | Discharge status: Home / Self Care | Payer: 59 | Source: Home / Self Care | Attending: Emergency Medicine | Admitting: Emergency Medicine

## 2020-08-20 ENCOUNTER — Encounter (HOSPITAL_COMMUNITY): Payer: Self-pay

## 2020-08-20 DIAGNOSIS — R0789 Other chest pain: Secondary | ICD-10-CM | POA: Insufficient documentation

## 2020-08-20 DIAGNOSIS — R0989 Other specified symptoms and signs involving the circulatory and respiratory systems: Secondary | ICD-10-CM | POA: Insufficient documentation

## 2020-08-20 DIAGNOSIS — R1906 Epigastric swelling, mass or lump: Secondary | ICD-10-CM | POA: Insufficient documentation

## 2020-08-20 DIAGNOSIS — K219 Gastro-esophageal reflux disease without esophagitis: Secondary | ICD-10-CM | POA: Insufficient documentation

## 2020-08-20 DIAGNOSIS — R002 Palpitations: Secondary | ICD-10-CM | POA: Diagnosis present

## 2020-08-20 DIAGNOSIS — Z20822 Contact with and (suspected) exposure to covid-19: Secondary | ICD-10-CM | POA: Insufficient documentation

## 2020-08-20 DIAGNOSIS — R198 Other specified symptoms and signs involving the digestive system and abdomen: Secondary | ICD-10-CM

## 2020-08-20 LAB — CBC WITH DIFFERENTIAL/PLATELET
Abs Immature Granulocytes: 0 10*3/uL (ref 0.00–0.07)
Abs Immature Granulocytes: 0.02 10*3/uL (ref 0.00–0.07)
Basophils Absolute: 0 10*3/uL (ref 0.0–0.1)
Basophils Absolute: 0 10*3/uL (ref 0.0–0.1)
Basophils Relative: 1 %
Basophils Relative: 1 %
Eosinophils Absolute: 0.1 10*3/uL (ref 0.0–0.5)
Eosinophils Absolute: 0 10*3/uL (ref 0.0–0.5)
Eosinophils Relative: 1 %
Eosinophils Relative: 0 %
HCT: 45.6 % (ref 39.0–52.0)
HCT: 45.9 % (ref 39.0–52.0)
Hemoglobin: 15.1 g/dL (ref 13.0–17.0)
Hemoglobin: 15.3 g/dL (ref 13.0–17.0)
Immature Granulocytes: 0 %
Immature Granulocytes: 0 %
Lymphocytes Relative: 61 %
Lymphocytes Relative: 30 %
Lymphs Abs: 3.5 10*3/uL (ref 0.7–4.0)
Lymphs Abs: 1.7 10*3/uL (ref 0.7–4.0)
MCH: 28.8 pg (ref 26.0–34.0)
MCH: 28.9 pg (ref 26.0–34.0)
MCHC: 33.1 g/dL (ref 30.0–36.0)
MCHC: 33.3 g/dL (ref 30.0–36.0)
MCV: 86.9 fL (ref 80.0–100.0)
MCV: 86.8 fL (ref 80.0–100.0)
Monocytes Absolute: 0.6 10*3/uL (ref 0.1–1.0)
Monocytes Absolute: 0.6 10*3/uL (ref 0.1–1.0)
Monocytes Relative: 11 %
Monocytes Relative: 10 %
Neutro Abs: 1.5 10*3/uL — ABNORMAL LOW (ref 1.7–7.7)
Neutro Abs: 3.4 10*3/uL (ref 1.7–7.7)
Neutrophils Relative %: 26 %
Neutrophils Relative %: 59 %
Platelets: 278 10*3/uL (ref 150–400)
Platelets: 277 10*3/uL (ref 150–400)
RBC: 5.25 MIL/uL (ref 4.22–5.81)
RBC: 5.29 MIL/uL (ref 4.22–5.81)
RDW: 12 % (ref 11.5–15.5)
RDW: 12 % (ref 11.5–15.5)
WBC: 5.6 10*3/uL (ref 4.0–10.5)
WBC: 5.7 10*3/uL (ref 4.0–10.5)
nRBC: 0 % (ref 0.0–0.2)
nRBC: 0 % (ref 0.0–0.2)

## 2020-08-20 LAB — COMPREHENSIVE METABOLIC PANEL
ALT: 16 U/L (ref 0–44)
AST: 20 U/L (ref 15–41)
Albumin: 4.7 g/dL (ref 3.5–5.0)
Alkaline Phosphatase: 45 U/L (ref 38–126)
Anion gap: 11 (ref 5–15)
BUN: 9 mg/dL (ref 6–20)
CO2: 25 mmol/L (ref 22–32)
Calcium: 9.9 mg/dL (ref 8.9–10.3)
Chloride: 102 mmol/L (ref 98–111)
Creatinine, Ser: 1.21 mg/dL (ref 0.61–1.24)
GFR, Estimated: >60 mL/min (ref >60)
Glucose, Bld: 96 mg/dL (ref 70–99)
Potassium: 3.9 mmol/L (ref 3.5–5.1)
Sodium: 138 mmol/L (ref 135–145)
Total Bilirubin: 1.8 mg/dL — ABNORMAL HIGH (ref 0.3–1.2)
Total Protein: 8.2 g/dL — ABNORMAL HIGH (ref 6.5–8.1)

## 2020-08-20 LAB — BASIC METABOLIC PANEL
Anion gap: 14 (ref 5–15)
BUN: 9 mg/dL (ref 6–20)
CO2: 24 mmol/L (ref 22–32)
Calcium: 9.6 mg/dL (ref 8.9–10.3)
Chloride: 100 mmol/L (ref 98–111)
Creatinine, Ser: 1.3 mg/dL — ABNORMAL HIGH (ref 0.61–1.24)
GFR, Estimated: >60 mL/min (ref >60)
Glucose, Bld: 141 mg/dL — ABNORMAL HIGH (ref 70–99)
Potassium: 3.6 mmol/L (ref 3.5–5.1)
Sodium: 138 mmol/L (ref 135–145)

## 2020-08-20 LAB — TROPONIN I (HIGH SENSITIVITY)
Troponin I (High Sensitivity): 3 ng/L (ref <18)
Troponin I (High Sensitivity): 3 ng/L (ref <18)

## 2020-08-20 LAB — SARS CORONAVIRUS 2 (TAT 6-24 HRS): SARS Coronavirus 2: NEGATIVE

## 2020-08-20 LAB — LIPASE, BLOOD: Lipase: 38 U/L (ref 11–51)

## 2020-08-20 LAB — D-DIMER, QUANTITATIVE: D-Dimer, Quant: 0.36 ug/mL-FEU (ref 0.00–0.50)

## 2020-08-20 MED ORDER — LIDOCAINE VISCOUS HCL 2 % MT SOLN
15.0000 mL | Freq: Once | OROMUCOSAL | Status: AC
  Administered 2020-08-20: 15 mL via ORAL
  Filled 2020-08-20: qty 15

## 2020-08-20 MED ORDER — SUCRALFATE 1 G PO TABS: 1.0000 g | ORAL_TABLET | Freq: Three times a day (TID) | ORAL | 0 refills | Status: DC

## 2020-08-20 MED ORDER — OMEPRAZOLE 20 MG PO CPDR: 20.0000 mg | DELAYED_RELEASE_CAPSULE | Freq: Every day | ORAL | 0 refills | Status: DC

## 2020-08-20 MED ORDER — ALUM & MAG HYDROXIDE-SIMETH 200-200-20 MG/5ML PO SUSP
30.0000 mL | Freq: Once | ORAL | Status: AC
  Administered 2020-08-20: 30 mL via ORAL
  Filled 2020-08-20: qty 30

## 2020-08-20 MED ORDER — FAMOTIDINE 20 MG PO TABS: 20.0000 mg | ORAL_TABLET | Freq: Two times a day (BID) | ORAL | 0 refills | Status: DC

## 2020-08-20 MED ORDER — OMEPRAZOLE 20 MG PO CPDR: 20.0000 mg | DELAYED_RELEASE_CAPSULE | Freq: Every day | ORAL | 0 refills | Status: AC

## 2020-08-20 MED ORDER — DICYCLOMINE HCL 20 MG PO TABS: 20.0000 mg | ORAL_TABLET | Freq: Two times a day (BID) | ORAL | 0 refills | Status: DC

## 2020-08-20 NOTE — Discharge Instructions (Addendum)
You have been prescribed a medication called Prilosec to take daily to help with possible gastroesophageal reflux.  Additionally below is the contact information for the gastroenterologist with whom you should follow-up.  May also follow-up with the cardiologist listed for evaluation of your palpitations.  You have been tested for COVID-19 at your request, you may follow-up on these test results in your MyChart app.  Return for new or worsening or severe symptoms.  Take medications as prescribed.

## 2020-08-20 NOTE — ED Triage Notes (Signed)
Pt reports that when he went to lay down tonight he began to have chest pressure and SOB, now resolved.

## 2020-08-20 NOTE — ED Provider Notes (Signed)
Emergency Medicine Provider Triage Evaluation Note  Carlos Lewis , a 35 y.o. male  was evaluated in triage.  Pt complains of chest pain and shortness of breath.  Symptoms started when he laid down to go to bed tonight.  He reports that he felt heaviness in his chest like he could not breathe.  He reports it felt like his heart was stopping.  He reports after a few minutes this seemed to resolve but he still feels some palpitations and fluttering in his chest.  No lightheadedness or syncope.  Was seen in the ED 2 days ago for tingling.  Blood pressure fluctuations thought to be related to anxiety, prescribed hydroxyzine but has not filled or began taking this medication.  Review of Systems  Positive: Chest pain, shortness of breath, palpitations, anxiety Negative: Fever, cough, abdominal pain  Physical Exam  BP (!) 161/92 (BP Location: Left Arm)   Pulse (!) 114   Temp 97.9 F (36.6 C)   Resp 18   SpO2 95%  Gen:   Awake, no distress, appears very anxious Resp:  Normal effort, CTA bilat, RRR MSK:   Moves extremities without difficulty  Other:    Medical Decision Making  Medically screening exam initiated at 2:54 AM.  Appropriate orders placed.  Famous Tritschler was informed that the remainder of the evaluation will be completed by another provider, this initial triage assessment does not replace that evaluation, and the importance of remaining in the ED until their evaluation is complete.     Dartha Lodge, PA-C 08/20/20 2778    Shon Baton, MD 08/20/20 [phone]

## 2020-08-20 NOTE — ED Provider Notes (Signed)
Emergency Medicine Provider Triage Evaluation Note  Carlos Lewis , a 35 y.o. male  was evaluated in triage.  Pt complains of chest discomfort, upper abdominal discomfort, difficulty breathing when trying to sleep.  Review of Systems  Positive: Chest discomfort, difficulty breathing Negative: Vomiting, diaphoresis, syncope, dizziness  Physical Exam  BP (!) 142/80 (BP Location: Right Arm)   Pulse 91   Temp 98.7 F (37.1 C) (Oral)   Resp 16   SpO2 99%  Gen:   Awake, no distress   Resp:  Normal effort lung sounds clear and equal bilaterally MSK:   Moves extremities without difficulty  Other:    Medical Decision Making  Medically screening exam initiated at 4:36 PM.  Appropriate orders placed.  Carlos Lewis was informed that the remainder of the evaluation will be completed by another provider, this initial triage assessment does not replace that evaluation, and the importance of remaining in the ED until their evaluation is complete.  Patient requesting to see on-call GI specialist.  I explained the process and offered to add additional medications to his GI regimen.  Patient ultimately declined and requested reevaluation.   Anselm Pancoast, PA-C 08/20/20 1745    Charlynne Pander, MD 08/21/20 [phone]

## 2020-08-20 NOTE — ED Provider Notes (Signed)
[facility] EMERGENCY DEPARTMENT Provider Note   CSN: [id number] Arrival date & time: 08/20/20  8325     History Chief Complaint  Patient presents with  . Palpitations    Carlos Lewis is a 35 y.o. male.  Patient presents to the emergency department with a chief complaint of chest pain.  He reports having some palpitations and chest pain earlier tonight.  He states that his symptoms have resolved completely now.  He states that he felt anxious.  He has history of anxiety.  Has history of acid reflux.  Did not take any medications prior to arrival.  He reports feeling a small bump/mass in the upper abdomen.   The history is provided by the patient. No language interpreter was used.       Past Medical History:  Diagnosis Date  . Cardiac murmur   . Complication of anesthesia   . Esophageal reflux   . PONV (postoperative nausea and vomiting)     There are no problems to display for this patient.   Past Surgical History:  Procedure Laterality Date  . ORCHIOPEXY Bilateral 11/15/2013   Procedure: bilateral orchiopexy;  Surgeon: Heloise Purpura, MD;  Location: [facility];  Service: Urology;  Laterality: Bilateral;  . pectorial muscle Left   . SCROTAL EXPLORATION Left 11/15/2013   Procedure: SCROTUM EXPLORATION;  Surgeon: Heloise Purpura, MD;  Location: [facility];  Service: Urology;  Laterality: Left;       No family history on file.  Social History   Tobacco Use  . Smoking status: Never Smoker  . Smokeless tobacco: Never Used  Vaping Use  . Vaping Use: Never used  Substance Use Topics  . Alcohol use: Yes    Comment: occasionally  . Drug use: No    Home Medications Prior to Admission medications   Medication Sig Start Date End Date Taking? Authorizing Provider  CHOLINE PO Take 1 tablet by mouth daily.    [provider]  HYDROcodone-acetaminophen (NORCO/VICODIN) 5-325 MG per tablet Take 1-2 tablets by mouth every 6 (six) hours as needed. 11/15/13    Heloise Purpura, MD  hydrOXYzine (ATARAX/VISTARIL) 25 MG tablet Take 1 tablet (25 mg total) by mouth every 8 (eight) hours as needed for up to 15 doses for anxiety. 08/18/20   Jacklynn Bue, MD  PROTEIN PO Take 1 tablet by mouth daily.    [provider]    Allergies    Other  Review of Systems   Review of Systems  All other systems reviewed and are negative.   Physical Exam Updated Vital Signs BP (!) 145/92   Pulse (!) 109   Temp 97.9 F (36.6 C)   Resp (!) 34   SpO2 98%   Physical Exam Vitals and nursing note reviewed.  Constitutional:      Appearance: He is well-developed.  HENT:     Head: Normocephalic and atraumatic.  Eyes:     Conjunctiva/sclera: Conjunctivae normal.  Cardiovascular:     Rate and Rhythm: Normal rate and regular rhythm.     Heart sounds: No murmur heard.   Pulmonary:     Effort: Pulmonary effort is normal. No respiratory distress.     Breath sounds: Normal breath sounds.  Abdominal:     Palpations: Abdomen is soft. There is mass.     Tenderness: There is no abdominal tenderness.     Comments: Small mass, just beneath the xiphoid, possible hiatal hernia.  Musculoskeletal:     Cervical back: Neck supple.  Skin:    General: Skin is warm and dry.  Neurological:     Mental Status: He is alert.     ED Results / Procedures / Treatments   Labs (all labs ordered are listed, but only abnormal results are displayed) Labs Reviewed  BASIC METABOLIC PANEL - Abnormal; Notable for the following components:      Result Value   Glucose, Bld 141 (*)    Creatinine, Ser 1.30 (*)    All other components within normal limits  CBC WITH DIFFERENTIAL/PLATELET - Abnormal; Notable for the following components:   Neutro Abs 1.5 (*)    All other components within normal limits  D-DIMER, QUANTITATIVE  TROPONIN I (HIGH SENSITIVITY)  TROPONIN I (HIGH SENSITIVITY)    EKG None  Radiology DG Chest 2 View  Result Date: 08/20/2020 CLINICAL DATA:   Chest pain EXAM: CHEST - 2 VIEW COMPARISON:  12/13/2018 FINDINGS: The heart size and mediastinal contours are within normal limits. Both lungs are clear. The visualized skeletal structures are unremarkable. IMPRESSION: No active cardiopulmonary disease. Electronically Signed   By: Helyn Numbers MD   On: 08/20/2020 04:17    Procedures Procedures   Medications Ordered in ED Medications - No data to display  ED Course  I have reviewed the triage vital signs and the nursing notes.  Pertinent labs & imaging results that were available during my care of the patient were reviewed by me and considered in my medical decision making (see chart for details).    MDM Rules/Calculators/A&P                          Patient here with palpitations that started earlier.  She reports associated chest pain.  He states that his symptoms have now completely resolved.  He is noted to be tachycardic, but is also quite anxious.  He is not hypoxic.  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  I do think that his symptoms are likely anxiety driven, but given his risk factors, will check D-dimer.  He does have a small palpable mass in the epigastric abdomen, this might be consistent with a hiatal hernia.  Patient does report history of acid reflux.  Will prescribe omeprazole, recommend GI follow-up.  Ambulatory referral to GI placed.  Otherwise, laboratory work-up is reassuring.  Troponins are negative.  No ischemic changes on EKG.  Patient signed out to oncoming team, who will follow up on D-dimer.  If negative, plan for discharge with outpatient GI follow-up. Final Clinical Impression(s) / ED Diagnoses Final diagnoses:  Palpitations  Epigastric mass    Rx / DC Orders ED Discharge Orders         Ordered    Ambulatory referral to Gastroenterology        08/20/20 0612    omeprazole (PRILOSEC) 20 MG capsule  Daily        08/20/20 0613           Roxy Horseman,  PA-C 08/20/20 2836    Wilkie Aye, Mayer Masker, MD 08/20/20 [phone]

## 2020-08-20 NOTE — ED Provider Notes (Signed)
  Physical Exam  BP 127/78   Pulse 88   Temp 97.9 F (36.6 C)   Resp 19   SpO2 100%   Physical Exam  ED Course/Procedures     Procedures  EKG Interpretation  Date/Time:  Tuesday Aug 20 2020 02:36:42 EDT Ventricular Rate:  118 PR Interval:  150 QRS Duration: 84 QT Interval:  320 QTC Calculation: 448 R Axis:   86 Text Interpretation: Sinus tachycardia Otherwise normal ECG Confirmed by Cherlynn Perches ([id number]) on 08/20/2020 8:29:43 AM       MDM   [place] patient signed out preceding ED provider, Ivar Drape, PA-C at time of shift change.  Please see his associated note for further and signed the patient's ED course.  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  His symptoms had completely resolved by the time he arrived to the emergency department.  Additionally he has a small mass/bump in the upper abdomen that is palpable but nonconcerning.  He has been given gastroenterology follow-up for this issue. HEART score of 1.  CBC unremarkable, BMP with elevated creatinine to 1.3, previously 1.2.  Troponin is normal, 3.   Chest x-ray is negative for acute cardiopulmonary disease, and EKG is reassuring with sinus tachycardia without ischemic changes. At time of shift change patient is awaiting D-dimer results.  If positive will require CT angiogram to rule out PE given high risk occupation with long-haul driving.   Fortunately, D-dimer resulted within normal range, 0.36.  Patient reevaluated and is no longer tachycardic, with heart rate of 86, monitor is a into the room, continues to be chest pain-free.  Patient does endorse sensation of pressure in the epigastrium, he feels quite anxious about this and is concerned stating that because of that sensation "I laid flat last night and I could not breathe" states he is afraid to fall asleep because of that sensation he  experienced last night.  Will administer GI cocktail and reevaluate, however given next exceedingly reassuring work-up with normal troponins, EKG, electrolytes, and chest x-ray, doubt cardiopulmonary source for patient's symptoms.    Patient reevaluated after GI cocktail with improvement in his symptoms.  Patient does have extensive history of reflux in the past, and given recent symptoms concerning for GERD exacerbation.  Will be discharged with prescription for omeprazole as well as GI follow-up.  We will also offer cardiology follow-up for reported palpitations. Patient concerned regarding irritation in the back of his throat, I did discuss that this could be related to GERD, however patient requested COVID-19 test.  COVID PCR ordered, patient may follow test results in the MyChart app.  No further work-up warranted in ED at this time. Chrsitopher voiced understanding of medical evaluation and treatment plan.  Each of his questions was answered to his expressed satisfaction.  Return precautions were given.  Patient is well-appearing, stable, and appropriate for discharge at this time.  This chart was dictated using voice recognition software, Dragon. Despite the best efforts of this provider to proofread and correct errors, errors may still occur which can change documentation meaning.      Sherrilee Gilles 08/20/20 6834    Sabino Donovan, MD 08/21/20 [phone]

## 2020-08-20 NOTE — ED Triage Notes (Signed)
Pt reports he was seen last night and diagnosed with a hiatal hernia. C/o continued chest pressure and shortness of breath when lying flat.

## 2020-08-21 MED ORDER — FAMOTIDINE 20 MG PO TABS
20.0000 mg | ORAL_TABLET | Freq: Once | ORAL | Status: AC
  Administered 2020-08-21: 20 mg via ORAL
  Filled 2020-08-21: qty 1

## 2020-08-21 MED ORDER — PANTOPRAZOLE SODIUM 40 MG PO TBEC: 40.0000 mg | DELAYED_RELEASE_TABLET | Freq: Every day | ORAL | Status: DC

## 2020-08-21 NOTE — Discharge Instructions (Addendum)
Follow up with the gastroenterologist as planned. Take Prilosec twice daily and add Pepcid twice daily. Both of these medications can be found over the counter.   Return to the ED with any new or concerning symptoms.

## 2020-08-21 NOTE — ED Provider Notes (Signed)
[facility] EMERGENCY DEPARTMENT Provider Note   CSN: [id number] Arrival date & time: 08/20/20  1552     History Chief Complaint  Patient presents with  . Hernia    Carlos Lewis is a 35 y.o. male.  Patient to ED with symptoms of epigastric discomfort, extending into chest, intermittent sore throat causing the sensation of trouble swallowing and episodes of waking up at night unable to breathe. He also reports losing his voice, becoming hoarse, temporarily. No fever. Seen for same yesterday in the ED and returns with concern for going to sleep for fear of stopping breathing. No vomiting. He has been referred to GI for further evaluation after his thorough ED work up was essentially negative.   The history is provided by the patient. No language interpreter was used.       Past Medical History:  Diagnosis Date  . Cardiac murmur   . Complication of anesthesia   . Esophageal reflux   . PONV (postoperative nausea and vomiting)     There are no problems to display for this patient.   Past Surgical History:  Procedure Laterality Date  . ORCHIOPEXY Bilateral 11/15/2013   Procedure: bilateral orchiopexy;  Surgeon: Heloise Purpura, MD;  Location: [facility];  Service: Urology;  Laterality: Bilateral;  . pectorial muscle Left   . SCROTAL EXPLORATION Left 11/15/2013   Procedure: SCROTUM EXPLORATION;  Surgeon: Heloise Purpura, MD;  Location: [facility];  Service: Urology;  Laterality: Left;       No family history on file.  Social History   Tobacco Use  . Smoking status: Never Smoker  . Smokeless tobacco: Never Used  Vaping Use  . Vaping Use: Never used  Substance Use Topics  . Alcohol use: Yes    Comment: occasionally  . Drug use: No    Home Medications Prior to Admission medications   Medication Sig Start Date End Date Taking? Authorizing Provider  dicyclomine (BENTYL) 20 MG tablet Take 1 tablet (20 mg total) by mouth 2 (two) times daily. 08/20/20  Yes Joy,  Shawn C, PA-C  famotidine (PEPCID) 20 MG tablet Take 1 tablet (20 mg total) by mouth 2 (two) times daily for 5 days. 08/20/20 08/25/20 Yes Joy, Shawn C, PA-C  sucralfate (CARAFATE) 1 g tablet Take 1 tablet (1 g total) by mouth 4 (four) times daily -  with meals and at bedtime. 08/20/20 09/19/20 Yes Joy, Shawn C, PA-C  CHOLINE PO Take 1 tablet by mouth daily.    [provider]  HYDROcodone-acetaminophen (NORCO/VICODIN) 5-325 MG per tablet Take 1-2 tablets by mouth every 6 (six) hours as needed. 11/15/13   Heloise Purpura, MD  hydrOXYzine (ATARAX/VISTARIL) 25 MG tablet Take 1 tablet (25 mg total) by mouth every 8 (eight) hours as needed for up to 15 doses for anxiety. 08/18/20   Jacklynn Bue, MD  omeprazole (PRILOSEC) 20 MG capsule Take 1 capsule (20 mg total) by mouth daily. 08/20/20   Sponseller, Lupe Carney R, PA-C  PROTEIN PO Take 1 tablet by mouth daily.    [provider]    Allergies    Other  Review of Systems   Review of Systems  Constitutional: Negative for fever.  HENT: Positive for trouble swallowing and voice change.   Respiratory: Negative for cough and shortness of breath.   Cardiovascular: Positive for chest pain.  Gastrointestinal: Positive for abdominal pain. Negative for vomiting.  Musculoskeletal: Negative for back pain.    Physical Exam Updated Vital Signs BP (!) 141/85  Pulse 81   Temp 98.7 F (37.1 C) (Oral)   Resp 15   Ht 5\' 11"  (1.803 m)   Wt 90.3 kg   SpO2 100%   BMI 27.75 kg/m   Physical Exam Vitals and nursing note reviewed.  Constitutional:      Appearance: He is well-developed.  HENT:     Head: Normocephalic.  Cardiovascular:     Rate and Rhythm: Normal rate and regular rhythm.     Heart sounds: No murmur heard.   Pulmonary:     Effort: Pulmonary effort is normal.     Breath sounds: Normal breath sounds. No wheezing, rhonchi or rales.  Abdominal:     General: Bowel sounds are normal.     Palpations: Abdomen is soft.      Tenderness: There is no abdominal tenderness. There is no guarding or rebound.  Musculoskeletal:        General: Normal range of motion.     Cervical back: Normal range of motion and neck supple.  Skin:    General: Skin is warm and dry.     Findings: No rash.  Neurological:     Mental Status: He is alert and oriented to person, place, and time.     ED Results / Procedures / Treatments   Labs (all labs ordered are listed, but only abnormal results are displayed) Labs Reviewed  COMPREHENSIVE METABOLIC PANEL - Abnormal; Notable for the following components:      Result Value   Total Protein 8.2 (*)    Total Bilirubin 1.8 (*)    All other components within normal limits  LIPASE, BLOOD  CBC WITH DIFFERENTIAL/PLATELET   Results for orders placed or performed during the hospital encounter of 08/20/20  Lipase, blood  Result Value Ref Range   Lipase 38 11 - 51 U/L  Comprehensive metabolic panel  Result Value Ref Range   Sodium 138 135 - 145 mmol/L   Potassium 3.9 3.5 - 5.1 mmol/L   Chloride 102 98 - 111 mmol/L   CO2 25 22 - 32 mmol/L   Glucose, Bld 96 70 - 99 mg/dL   BUN 9 6 - 20 mg/dL   Creatinine, Ser 08/22/20 0.61 - 1.24 mg/dL   Calcium 9.9 8.9 - 2.11 mg/dL   Total Protein 8.2 (H) 6.5 - 8.1 g/dL   Albumin 4.7 3.5 - 5.0 g/dL   AST 20 15 - 41 U/L   ALT 16 0 - 44 U/L   Alkaline Phosphatase 45 38 - 126 U/L   Total Bilirubin 1.8 (H) 0.3 - 1.2 mg/dL   GFR, Estimated 94.1 >74 mL/min   Anion gap 11 5 - 15  CBC with Differential  Result Value Ref Range   WBC 5.7 4.0 - 10.5 K/uL   RBC 5.29 4.22 - 5.81 MIL/uL   Hemoglobin 15.3 13.0 - 17.0 g/dL   HCT >08 14.4 - 81.8 %   MCV 86.8 80.0 - 100.0 fL   MCH 28.9 26.0 - 34.0 pg   MCHC 33.3 30.0 - 36.0 g/dL   RDW 56.3 14.9 - 70.2 %   Platelets 277 150 - 400 K/uL   nRBC 0.0 0.0 - 0.2 %   Neutrophils Relative % 59 %   Neutro Abs 3.4 1.7 - 7.7 K/uL   Lymphocytes Relative 30 %   Lymphs Abs 1.7 0.7 - 4.0 K/uL   Monocytes Relative 10 %    Monocytes Absolute 0.6 0.1 - 1.0 K/uL   Eosinophils Relative 0 %  Eosinophils Absolute 0.0 0.0 - 0.5 K/uL   Basophils Relative 1 %   Basophils Absolute 0.0 0.0 - 0.1 K/uL   Immature Granulocytes 0 %   Abs Immature Granulocytes 0.02 0.00 - 0.07 K/uL    EKG EKG Interpretation  Date/Time:  Tuesday Aug 20 2020 16:55:41 EDT Ventricular Rate:  96 PR Interval:  164 QRS Duration: 88 QT Interval:  332 QTC Calculation: 419 R Axis:   90 Text Interpretation: Normal sinus rhythm Rightward axis Borderline ECG No significant change since last tracing Confirmed by Zadie Rhine ([id number]) on 08/20/2020 11:31:49 PM   Radiology DG Chest 2 View  Result Date: 08/20/2020 CLINICAL DATA:  Chest pain EXAM: CHEST - 2 VIEW COMPARISON:  12/13/2018 FINDINGS: The heart size and mediastinal contours are within normal limits. Both lungs are clear. The visualized skeletal structures are unremarkable. IMPRESSION: No active cardiopulmonary disease. Electronically Signed   By: Helyn Numbers MD   On: 08/20/2020 04:17    Procedures Procedures   Medications Ordered in ED Medications - No data to display  ED Course  I have reviewed the triage vital signs and the nursing notes.  Pertinent labs & imaging results that were available during my care of the patient were reviewed by me and considered in my medical decision making (see chart for details).    MDM Rules/Calculators/A&P                          Patient to ED with ss/sxs as per hPI.  He is overall well appearing and in NAD. VSS. Exam unremarkable. Labs repeated and show only a mildly elevated total bilirubin of 1.8. COVID performed yesterday was negative.   Discussed at length that his symptoms of chest pain, epigastric pain, globus sensation and hoarse voice were c/w reflux.   He is observed for several hours in the ED while supine, at rest, and he denies symptoms. He is comfortable with discharge home. Encouraged plan to f/u with GI. Recommended  taking his prilosec twice daily and adding pepcid.   Final Clinical Impression(s) / ED Diagnoses Final diagnoses:  None   1. GERD 2. Globus sensation  Rx / DC Orders ED Discharge Orders         Ordered    famotidine (PEPCID) 20 MG tablet  2 times daily        08/20/20 1656    sucralfate (CARAFATE) 1 g tablet  3 times daily with meals & bedtime        08/20/20 1656    dicyclomine (BENTYL) 20 MG tablet  2 times daily        08/20/20 1656           Elpidio Anis, PA-C 08/21/20 0309    Zadie Rhine, MD 08/21/20 [phone]

## 2020-10-01 ENCOUNTER — Ambulatory Visit: Payer: 59 | Admitting: Cardiovascular Disease

## 2020-10-01 ENCOUNTER — Encounter: Payer: Self-pay | Admitting: Cardiovascular Disease

## 2020-10-01 ENCOUNTER — Other Ambulatory Visit: Payer: Self-pay

## 2020-10-01 DIAGNOSIS — R0789 Other chest pain: Secondary | ICD-10-CM | POA: Diagnosis not present

## 2020-10-01 LAB — LIPID PANEL
Chol/HDL Ratio: 3.7 ratio (ref 0.0–5.0)
Cholesterol, Total: 136 mg/dL (ref 100–199)
HDL: 37 mg/dL — ABNORMAL LOW (ref >39)
LDL Chol Calc (NIH): 85 mg/dL (ref 0–99)
Triglycerides: 67 mg/dL (ref 0–149)
VLDL Cholesterol Cal: 14 mg/dL (ref 5–40)

## 2020-10-01 LAB — HEPATIC FUNCTION PANEL
ALT: 18 IU/L (ref 0–44)
AST: 21 IU/L (ref 0–40)
Albumin: 4.7 g/dL (ref 4.0–5.0)
Alkaline Phosphatase: 59 IU/L (ref 44–121)
Bilirubin Total: 1.2 mg/dL (ref 0.0–1.2)
Bilirubin, Direct: 0.24 mg/dL (ref 0.00–0.40)
Total Protein: 7.5 g/dL (ref 6.0–8.5)

## 2020-10-01 NOTE — Assessment & Plan Note (Signed)
Carlos Lewis was referred by the ER for atypical chest pain.  He has no cardiac risk factors.  He was recently diagnosed with GERD and had endoscopy on 09/23/2020 revealing hiatal hernia, esophagitis and gastritis.  He is on Prilosec.  Symptoms have improved.  He is strictly active and does not get chest pain with exertion.  He was given some pain on right great recumbency as well.  I reassured him that I am convinced that his pain is nonischemic and we will see him back as needed.

## 2020-10-01 NOTE — Progress Notes (Signed)
10/01/2020 Carlos Lewis   [birth year]  [id number]  Primary Physician Carlos Sacramento, NP Primary Cardiologist: Carlos Gess MD Carlos Lewis  HPI:  Carlos Lewis is a 35 y.o. 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  He was referred by the emergency room for evaluation of atypical chest pain.  I apparently take care of his mother as well.  He has no cardiac risk factors.  He is caffeine sensitive and is aware of this and avoids caffeine because of tachypalpitations that it causes.  He was seen in the ER at the end of May for symptoms of arm numbness and atypical chest abdominal pain.  He underwent an endoscopy 09/23/2020 revealing hiatal hernia, gastritis and esophagitis.  Since being on Prilosec his symptoms have significantly improved.    Current Meds  Medication Sig   omeprazole (PRILOSEC) 20 MG capsule Take 1 capsule (20 mg total) by mouth daily. (Patient taking differently: Take 20 mg by mouth once a week.)   [DISCONTINUED] CHOLINE PO Take 1 tablet by mouth daily.   [DISCONTINUED] dicyclomine (BENTYL) 20 MG tablet Take 1 tablet (20 mg total) by mouth 2 (two) times daily.   [DISCONTINUED] HYDROcodone-acetaminophen (NORCO/VICODIN) 5-325 MG per tablet Take 1-2 tablets by mouth every 6 (six) hours as needed.   [DISCONTINUED] hydrOXYzine (ATARAX/VISTARIL) 25 MG tablet Take 1 tablet (25 mg total) by mouth every 8 (eight) hours as needed for up to 15 doses for anxiety.   [DISCONTINUED] PROTEIN PO Take 1 tablet by mouth daily.     Allergies  Allergen Reactions   Other     Anesthesia from former surgery--burning sensation in arm.     Social History   Socioeconomic History   Marital status: Single    Spouse name: Not on file   Number of children: Not on file   Years of education: Not on file   Highest education level: Not on file  Occupational History   Not on file  Tobacco Use   Smoking status: Never    Smokeless tobacco: Never  Vaping Use   Vaping Use: Never used  Substance and Sexual Activity   Alcohol use: Yes    Comment: occasionally   Drug use: No   Sexual activity: Not on file  Other Topics Concern   Not on file  Social History Narrative   Not on file   Social Determinants of Health   Financial Resource Strain: Not on file  Food Insecurity: Not on file  Transportation Needs: Not on file  Physical Activity: Not on file  Stress: Not on file  Social Connections: Not on file  Intimate Partner Violence: Not on file     Review of Systems: General: negative for chills, fever, night sweats or weight changes.  Cardiovascular: negative for chest pain, dyspnea on exertion, edema, orthopnea, palpitations, paroxysmal nocturnal dyspnea or shortness of breath Dermatological: negative for rash Respiratory: negative for cough or wheezing Urologic: negative for hematuria Abdominal: negative for nausea, vomiting, diarrhea, bright red blood per rectum, melena, or hematemesis Neurologic: negative for visual changes, syncope, or dizziness All other systems reviewed and are otherwise negative except as noted above.    Blood pressure 118/70, pulse 71, height 5\' 11"  (1.803 m), weight 205 lb (93 kg).  General appearance: alert and no distress Neck: no adenopathy, no carotid bruit, no JVD, supple, symmetrical, trachea midline, and thyroid not enlarged, symmetric, no tenderness/mass/nodules Lungs: clear to auscultation bilaterally Heart:  regular rate and rhythm, S1, S2 normal, no murmur, click, rub or gallop Extremities: extremities normal, atraumatic, no cyanosis or edema Pulses: 2+ and symmetric Skin: Skin color, texture, turgor normal. No rashes or lesions Neurologic: Grossly normal  EKG sinus rhythm at 71 without ST or T wave changes.  I personally reviewed this EKG.  ASSESSMENT AND PLAN:   Atypical chest pain Carlos Lewis was referred by the ER for atypical chest pain.  He has no  cardiac risk factors.  He was recently diagnosed with GERD and had endoscopy on 09/23/2020 revealing hiatal hernia, esophagitis and gastritis.  He is on Prilosec.  Symptoms have improved.  He is strictly active and does not get chest pain with exertion.  He was given some pain on right great recumbency as well.  I reassured him that I am convinced that his pain is nonischemic and we will see him back as needed.     Carlos Gess MD FACP,FACC,FAHA, [facility] 10/01/2020 10:30 AM

## 2020-10-01 NOTE — Patient Instructions (Signed)
Medication Instructions:  No Changes In Medications at this time.  *If you need a refill on your cardiac medications before your next appointment, please call your pharmacy*  Lab Work: LIPID/LIVER BLOOD WORK TODAY  If you have labs (blood work) drawn today and your tests are completely normal, you will receive your results only by: MyChart Message (if you have MyChart) OR A paper copy in the mail If you have any lab test that is abnormal or we need to change your treatment, we will call you to review the results.  Follow-Up: At [facility], you and your health needs are our priority.  As part of our continuing mission to provide you with exceptional heart care, we have created designated Provider Care Teams.  These Care Teams include your primary Cardiologist (physician) and Advanced Practice Providers (APPs -  Physician Assistants and Nurse Practitioners) who all work together to provide you with the care you need, when you need it.  Your next appointment:   AS NEEDED   The format for your next appointment:   In Person  Provider:   Nanetta Batty, MD

## 2021-07-20 IMAGING — CR DG CHEST 2V
2 series · 2 of 2 positions shown · non-contrast
Comparison: None.

CLINICAL DATA: Chest pain for 2-3 weeks

EXAM:
CHEST - 2 VIEW

[w chest pa]
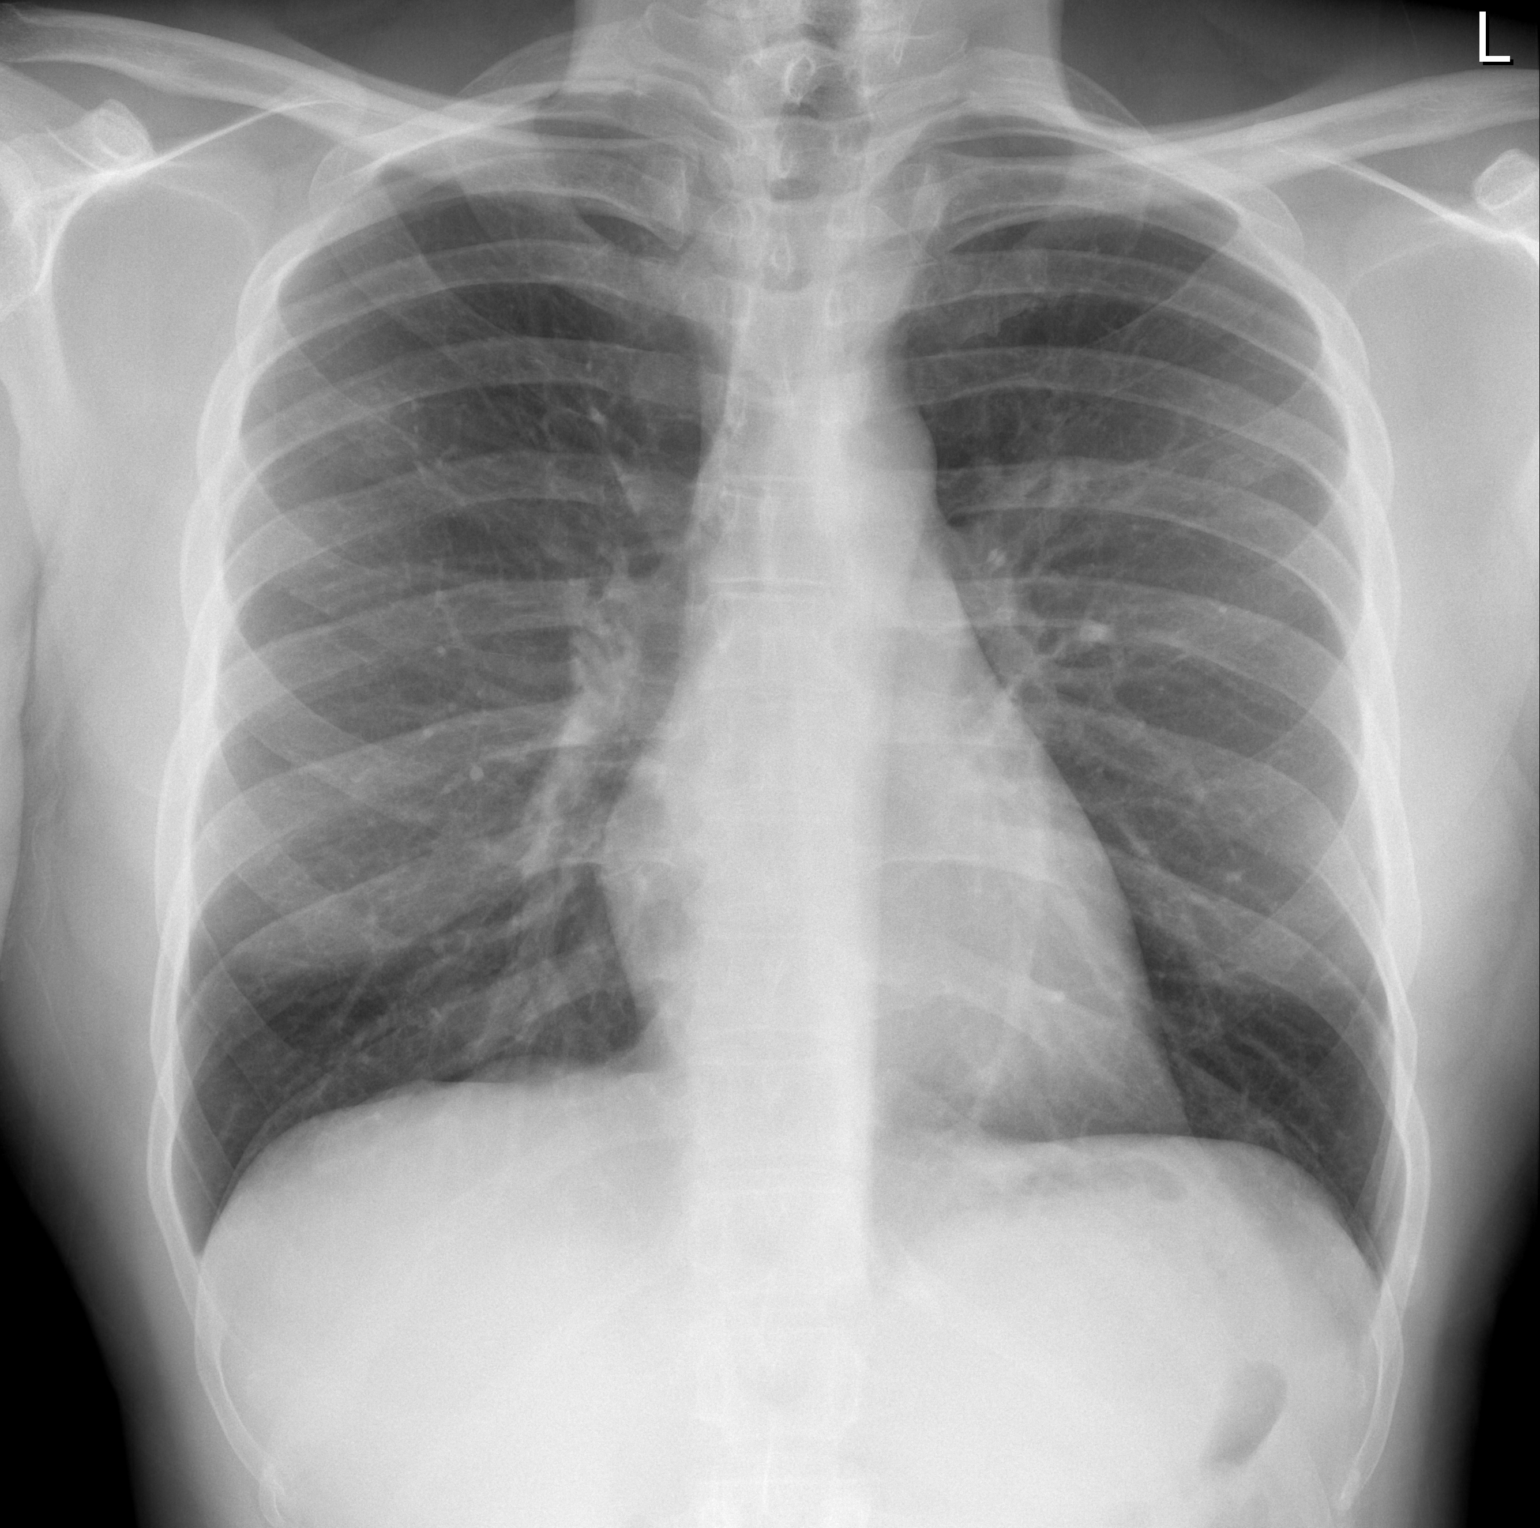

[w chest lat]
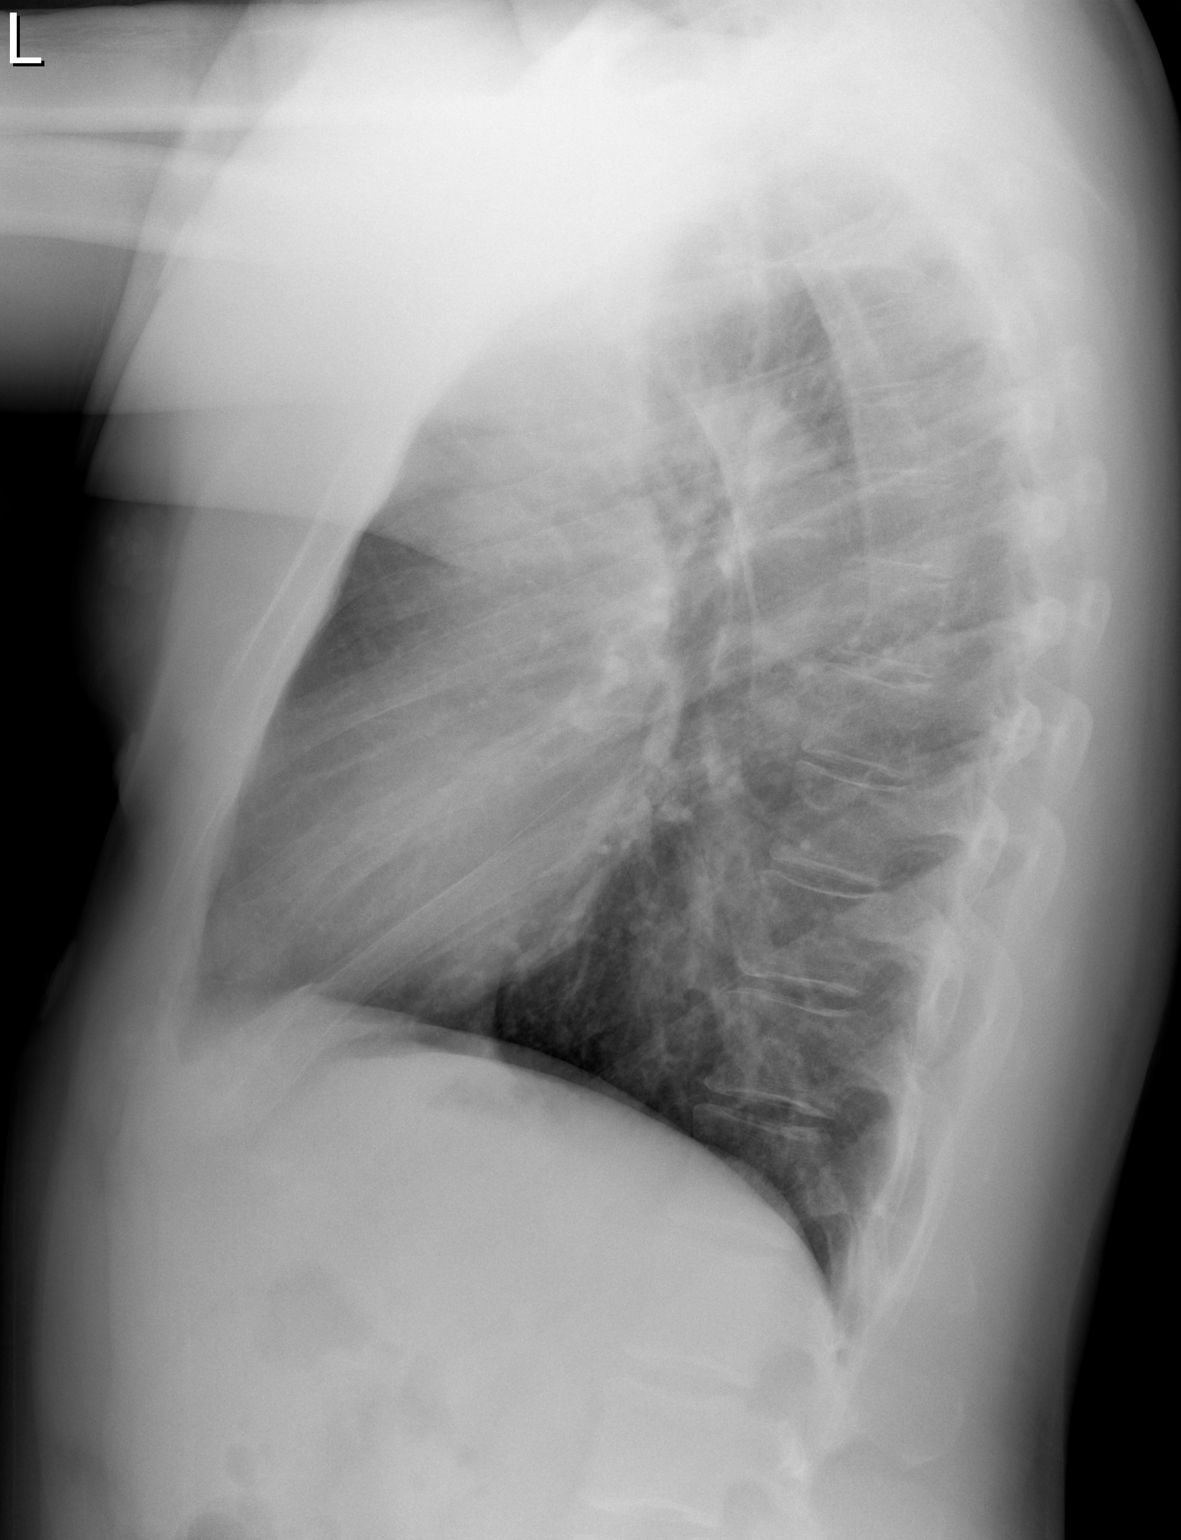

[2 of 2 positions shown; findings below may reference images not displayed]

FINDINGS: The heart size and mediastinal contours are within normal limits.
Both lungs are clear. The visualized skeletal structures are
unremarkable.
IMPRESSION: No active cardiopulmonary disease.

## 2023-03-28 IMAGING — CR DG CHEST 2V
2 series · 2 of 2 positions shown · non-contrast
Comparison: 12/13/2018

CLINICAL DATA: Chest pain

EXAM:
CHEST - 2 VIEW

[chest pa]
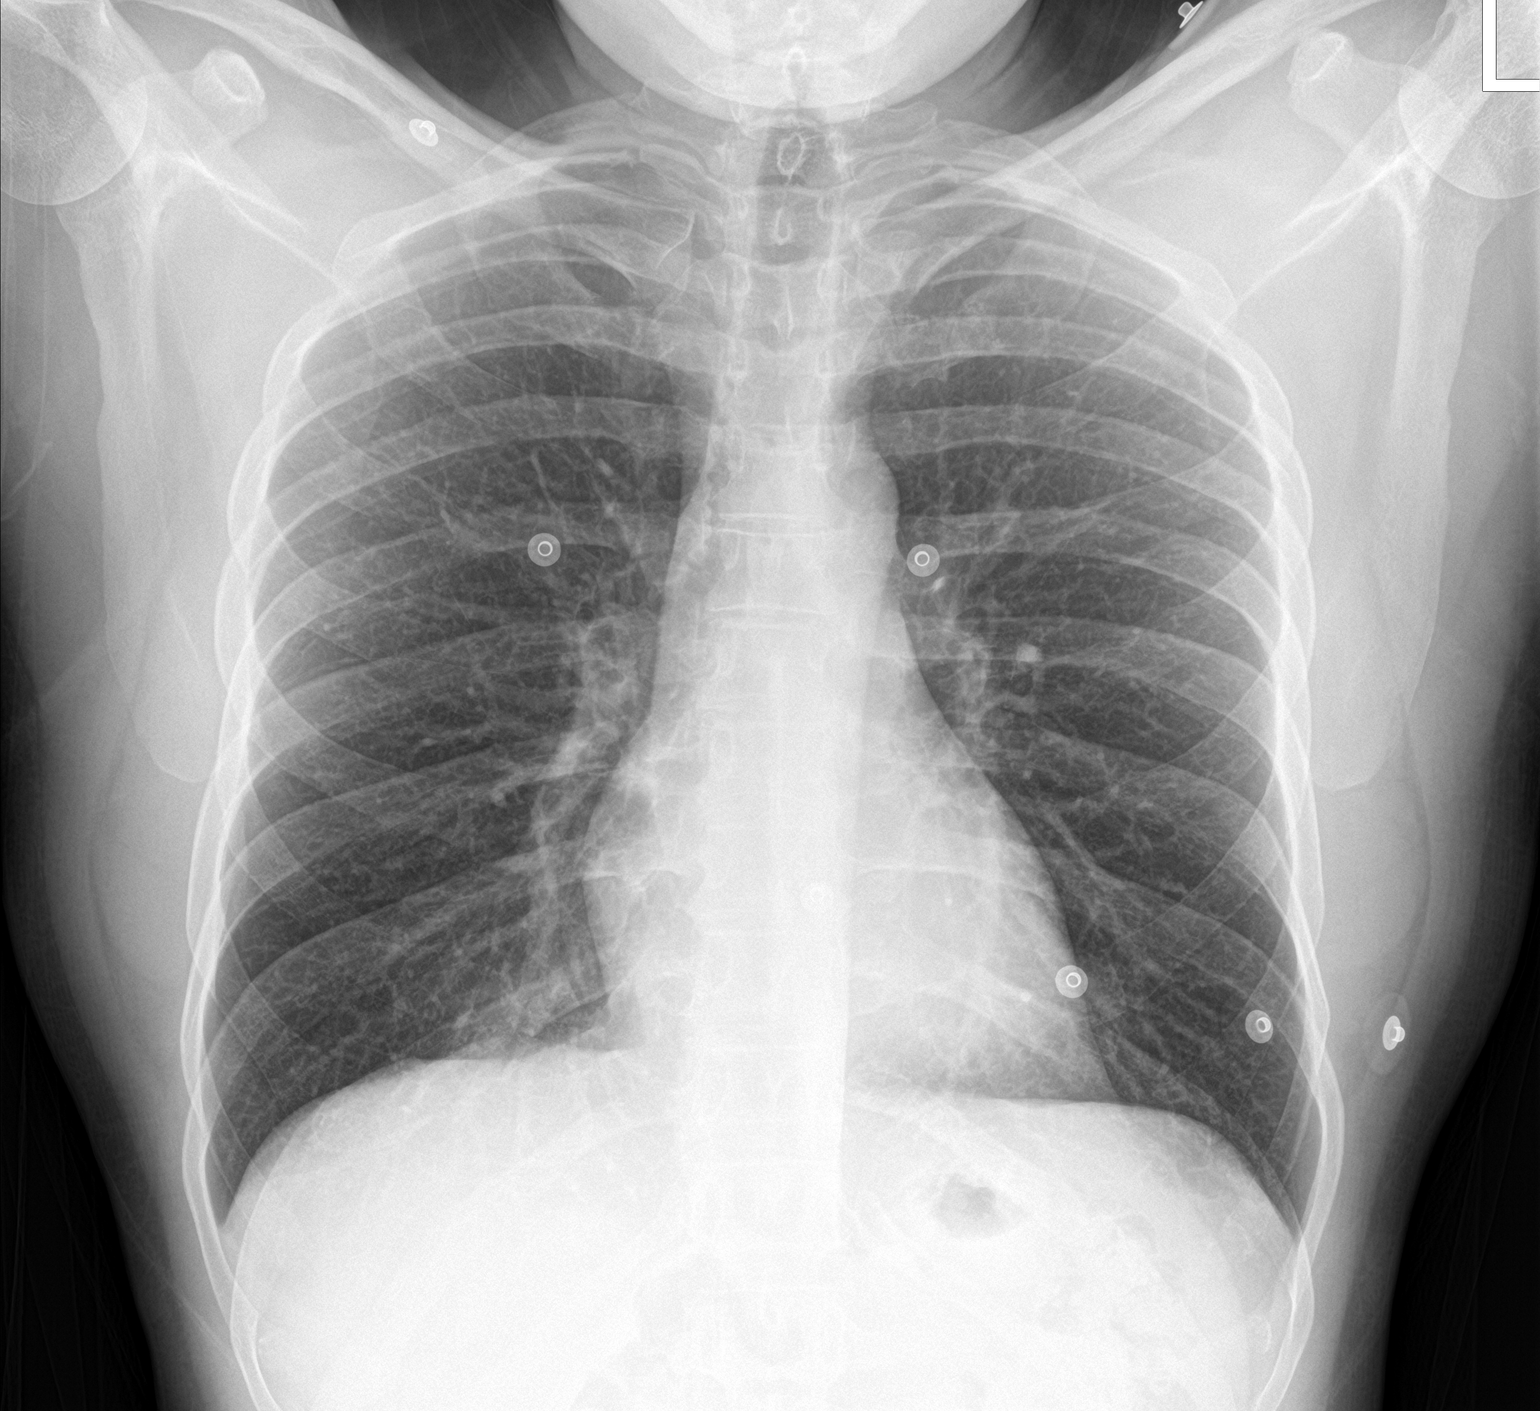

[chest lat]
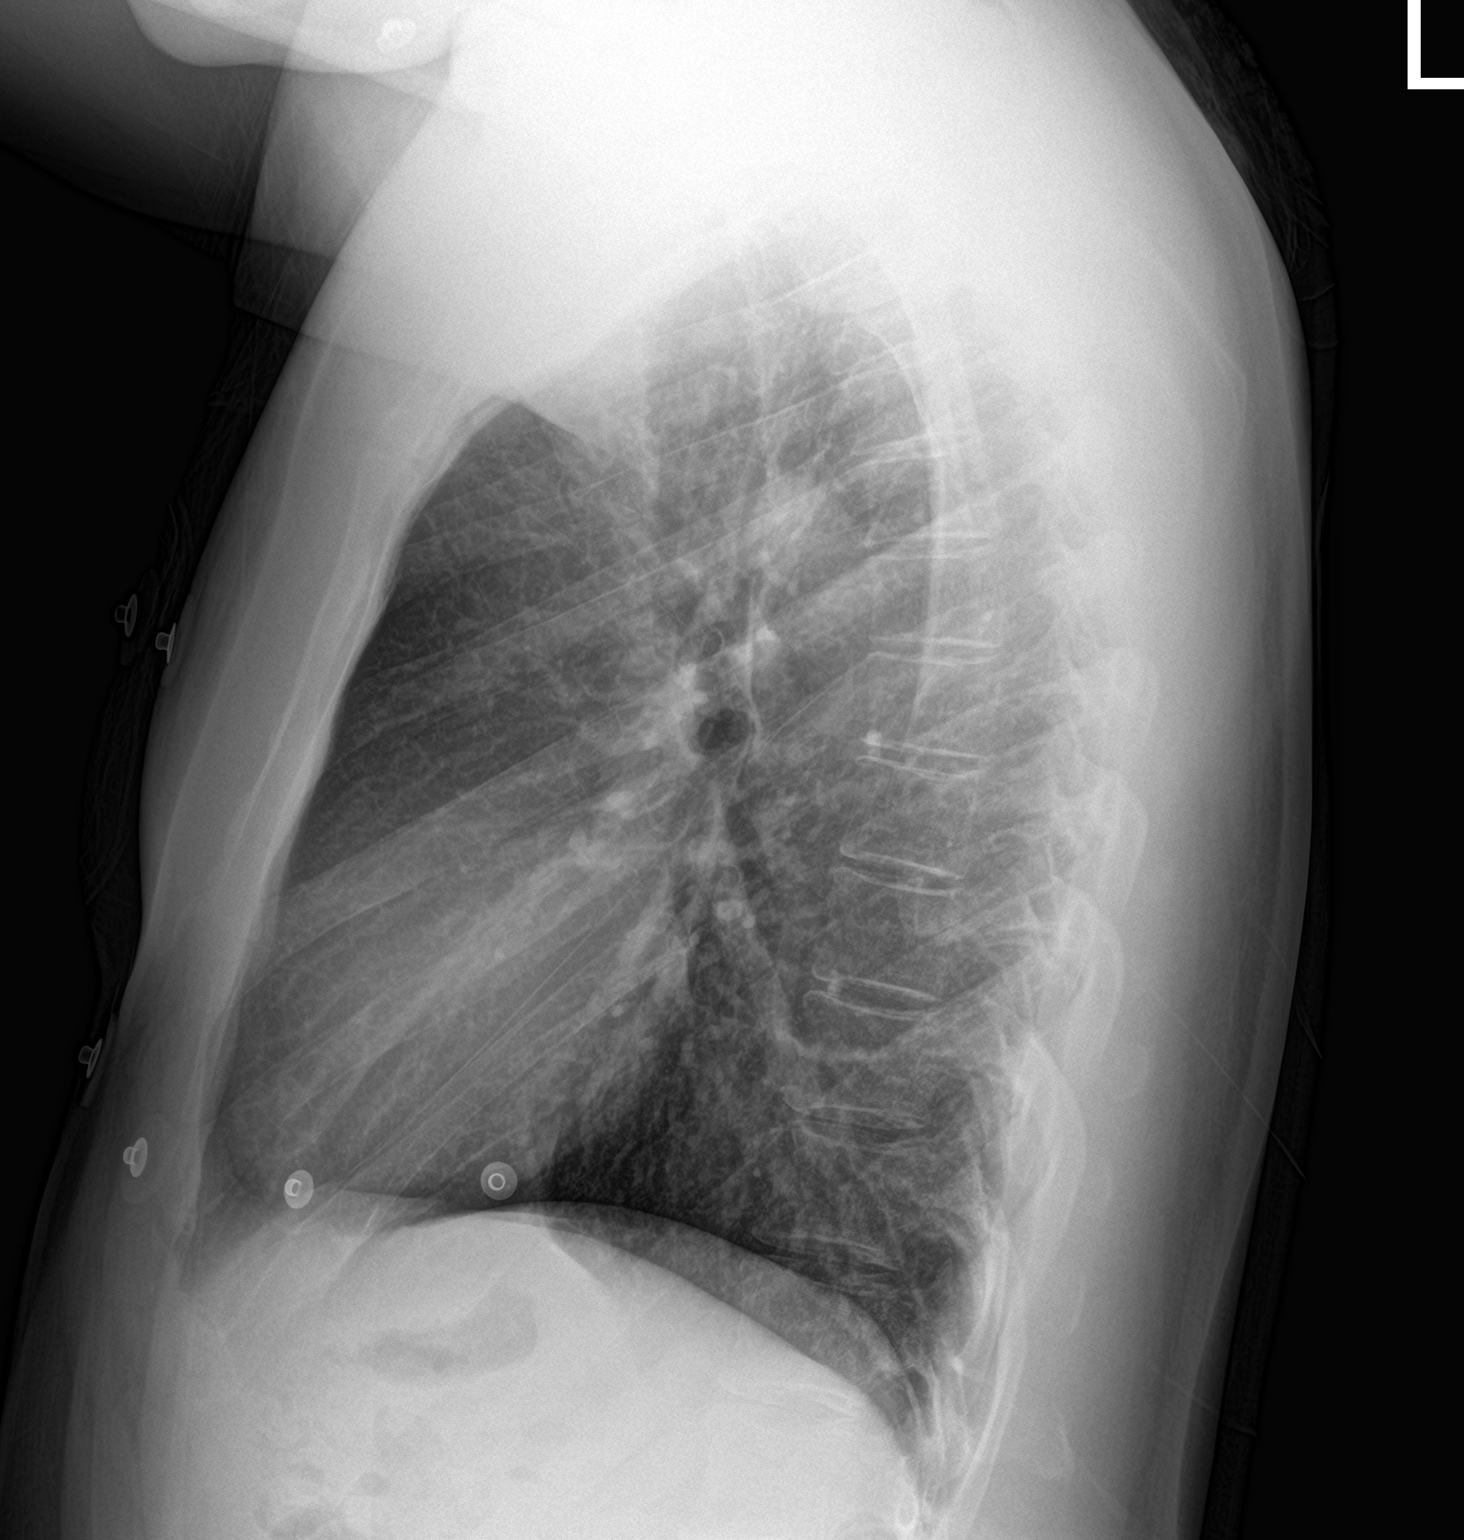

[2 of 2 positions shown; findings below may reference images not displayed]

FINDINGS: The heart size and mediastinal contours are within normal limits.
Both lungs are clear. The visualized skeletal structures are
unremarkable.
IMPRESSION: No active cardiopulmonary disease.
